# Patient Record
Sex: Female | Born: 1965 | Race: White | Hispanic: No | Marital: Married | Smoking: Former smoker
Health system: Southern US, Community
[De-identification: ages and names within clinical notes are randomized; demographics above are authoritative.]

## PROBLEM LIST (undated history)

## (undated) DIAGNOSIS — E039 Hypothyroidism, unspecified: Secondary | ICD-10-CM

## (undated) DIAGNOSIS — M8580 Other specified disorders of bone density and structure, unspecified site: Secondary | ICD-10-CM

## (undated) HISTORY — PX: GYNECOLOGIC CRYOSURGERY: SHX857

## (undated) HISTORY — PX: NOSE SURGERY: SHX723

## (undated) HISTORY — DX: Hypothyroidism, unspecified: E03.9

## (undated) HISTORY — PX: MANDIBLE SURGERY: SHX707

## (undated) HISTORY — PX: COLPOSCOPY: SHX161

## (undated) HISTORY — DX: Other specified disorders of bone density and structure, unspecified site: M85.80

---

## 1998-02-13 ENCOUNTER — Ambulatory Visit (HOSPITAL_COMMUNITY): Admission: RE | Admit: 1998-02-13 | Discharge: 1998-02-13 | Payer: Self-pay | Admitting: *Deleted

## 1999-02-24 ENCOUNTER — Ambulatory Visit (HOSPITAL_COMMUNITY): Admission: RE | Admit: 1999-02-24 | Discharge: 1999-02-24 | Payer: Self-pay | Admitting: *Deleted

## 1999-02-24 ENCOUNTER — Encounter: Payer: Self-pay | Admitting: *Deleted

## 1999-03-01 ENCOUNTER — Ambulatory Visit (HOSPITAL_COMMUNITY): Admission: RE | Admit: 1999-03-01 | Discharge: 1999-03-01 | Payer: Self-pay | Admitting: *Deleted

## 1999-04-12 ENCOUNTER — Other Ambulatory Visit: Admission: RE | Admit: 1999-04-12 | Discharge: 1999-04-12 | Payer: Self-pay | Admitting: Obstetrics and Gynecology

## 1999-08-18 ENCOUNTER — Inpatient Hospital Stay (HOSPITAL_COMMUNITY): Admission: AD | Admit: 1999-08-18 | Discharge: 1999-08-18 | Payer: Self-pay | Admitting: Obstetrics and Gynecology

## 1999-08-26 ENCOUNTER — Encounter (INDEPENDENT_AMBULATORY_CARE_PROVIDER_SITE_OTHER): Payer: Self-pay | Admitting: Specialist

## 1999-08-26 ENCOUNTER — Ambulatory Visit (HOSPITAL_COMMUNITY): Admission: RE | Admit: 1999-08-26 | Discharge: 1999-08-26 | Payer: Self-pay | Admitting: *Deleted

## 1999-10-26 ENCOUNTER — Ambulatory Visit (HOSPITAL_COMMUNITY): Admission: RE | Admit: 1999-10-26 | Discharge: 1999-10-26 | Payer: Self-pay | Admitting: *Deleted

## 1999-10-26 ENCOUNTER — Encounter: Payer: Self-pay | Admitting: *Deleted

## 2000-03-03 ENCOUNTER — Inpatient Hospital Stay (HOSPITAL_COMMUNITY): Admission: EM | Admit: 2000-03-03 | Discharge: 2000-03-03 | Payer: Self-pay | Admitting: *Deleted

## 2000-06-01 ENCOUNTER — Other Ambulatory Visit: Admission: RE | Admit: 2000-06-01 | Discharge: 2000-06-01 | Payer: Self-pay | Admitting: *Deleted

## 2000-06-22 ENCOUNTER — Encounter: Payer: Self-pay | Admitting: *Deleted

## 2000-06-22 ENCOUNTER — Ambulatory Visit (HOSPITAL_COMMUNITY): Admission: RE | Admit: 2000-06-22 | Discharge: 2000-06-22 | Payer: Self-pay | Admitting: *Deleted

## 2000-08-04 ENCOUNTER — Encounter: Payer: Self-pay | Admitting: *Deleted

## 2000-08-04 ENCOUNTER — Ambulatory Visit (HOSPITAL_COMMUNITY): Admission: RE | Admit: 2000-08-04 | Discharge: 2000-08-04 | Payer: Self-pay | Admitting: *Deleted

## 2000-12-15 ENCOUNTER — Inpatient Hospital Stay (HOSPITAL_COMMUNITY): Admission: AD | Admit: 2000-12-15 | Discharge: 2000-12-18 | Payer: Self-pay | Admitting: *Deleted

## 2000-12-19 ENCOUNTER — Encounter: Admission: RE | Admit: 2000-12-19 | Discharge: 2001-02-22 | Payer: Self-pay | Admitting: *Deleted

## 2000-12-20 ENCOUNTER — Inpatient Hospital Stay (HOSPITAL_COMMUNITY): Admission: AD | Admit: 2000-12-20 | Discharge: 2000-12-20 | Payer: Self-pay | Admitting: *Deleted

## 2001-01-11 ENCOUNTER — Encounter: Payer: Self-pay | Admitting: *Deleted

## 2001-01-11 ENCOUNTER — Observation Stay (HOSPITAL_COMMUNITY): Admission: AD | Admit: 2001-01-11 | Discharge: 2001-01-12 | Payer: Self-pay | Admitting: *Deleted

## 2001-08-13 ENCOUNTER — Other Ambulatory Visit: Admission: RE | Admit: 2001-08-13 | Discharge: 2001-08-13 | Payer: Self-pay | Admitting: *Deleted

## 2002-07-03 ENCOUNTER — Other Ambulatory Visit: Admission: RE | Admit: 2002-07-03 | Discharge: 2002-07-03 | Payer: Self-pay | Admitting: *Deleted

## 2002-11-05 ENCOUNTER — Ambulatory Visit (HOSPITAL_COMMUNITY): Admission: RE | Admit: 2002-11-05 | Discharge: 2002-11-05 | Payer: Self-pay | Admitting: Cardiology

## 2002-11-05 ENCOUNTER — Encounter (INDEPENDENT_AMBULATORY_CARE_PROVIDER_SITE_OTHER): Payer: Self-pay | Admitting: Cardiology

## 2003-07-24 ENCOUNTER — Other Ambulatory Visit: Admission: RE | Admit: 2003-07-24 | Discharge: 2003-07-24 | Payer: Self-pay | Admitting: *Deleted

## 2004-01-06 ENCOUNTER — Other Ambulatory Visit: Admission: RE | Admit: 2004-01-06 | Discharge: 2004-01-06 | Payer: Self-pay | Admitting: Obstetrics and Gynecology

## 2004-07-27 ENCOUNTER — Other Ambulatory Visit: Admission: RE | Admit: 2004-07-27 | Discharge: 2004-07-27 | Payer: Self-pay | Admitting: Obstetrics and Gynecology

## 2004-12-29 ENCOUNTER — Other Ambulatory Visit: Admission: RE | Admit: 2004-12-29 | Discharge: 2004-12-29 | Payer: Self-pay | Admitting: Obstetrics and Gynecology

## 2005-07-20 ENCOUNTER — Other Ambulatory Visit: Admission: RE | Admit: 2005-07-20 | Discharge: 2005-07-20 | Payer: Self-pay | Admitting: Obstetrics and Gynecology

## 2006-03-09 ENCOUNTER — Other Ambulatory Visit: Admission: RE | Admit: 2006-03-09 | Discharge: 2006-03-09 | Payer: Self-pay | Admitting: Obstetrics and Gynecology

## 2006-10-16 ENCOUNTER — Other Ambulatory Visit: Admission: RE | Admit: 2006-10-16 | Discharge: 2006-10-16 | Payer: Self-pay | Admitting: Obstetrics and Gynecology

## 2007-03-22 ENCOUNTER — Other Ambulatory Visit: Admission: RE | Admit: 2007-03-22 | Discharge: 2007-03-22 | Payer: Self-pay | Admitting: Obstetrics and Gynecology

## 2008-04-02 ENCOUNTER — Other Ambulatory Visit: Admission: RE | Admit: 2008-04-02 | Discharge: 2008-04-02 | Payer: Self-pay | Admitting: Obstetrics and Gynecology

## 2009-04-10 ENCOUNTER — Ambulatory Visit: Payer: Self-pay | Admitting: Obstetrics and Gynecology

## 2009-04-10 ENCOUNTER — Encounter: Payer: Self-pay | Admitting: Obstetrics and Gynecology

## 2009-04-10 ENCOUNTER — Other Ambulatory Visit: Admission: RE | Admit: 2009-04-10 | Discharge: 2009-04-10 | Payer: Self-pay | Admitting: Obstetrics and Gynecology

## 2009-05-01 ENCOUNTER — Encounter: Admission: RE | Admit: 2009-05-01 | Discharge: 2009-05-01 | Payer: Self-pay | Admitting: Radiology

## 2009-11-30 ENCOUNTER — Ambulatory Visit: Payer: Self-pay | Admitting: Obstetrics and Gynecology

## 2010-04-20 ENCOUNTER — Other Ambulatory Visit: Admission: RE | Admit: 2010-04-20 | Discharge: 2010-04-20 | Payer: Self-pay | Admitting: Obstetrics and Gynecology

## 2010-04-20 ENCOUNTER — Ambulatory Visit: Payer: Self-pay | Admitting: Obstetrics and Gynecology

## 2010-11-05 ENCOUNTER — Ambulatory Visit
Admission: RE | Admit: 2010-11-05 | Discharge: 2010-11-05 | Payer: Self-pay | Source: Home / Self Care | Attending: Obstetrics and Gynecology | Admitting: Obstetrics and Gynecology

## 2010-11-08 ENCOUNTER — Ambulatory Visit
Admission: RE | Admit: 2010-11-08 | Discharge: 2010-11-08 | Payer: Self-pay | Source: Home / Self Care | Attending: Obstetrics and Gynecology | Admitting: Obstetrics and Gynecology

## 2010-11-21 ENCOUNTER — Encounter: Payer: Self-pay | Admitting: Obstetrics and Gynecology

## 2010-12-21 ENCOUNTER — Ambulatory Visit: Payer: Self-pay | Admitting: Obstetrics and Gynecology

## 2010-12-21 ENCOUNTER — Other Ambulatory Visit: Payer: Self-pay

## 2010-12-22 ENCOUNTER — Other Ambulatory Visit: Payer: Self-pay

## 2010-12-22 ENCOUNTER — Ambulatory Visit: Payer: Self-pay | Admitting: Obstetrics and Gynecology

## 2010-12-22 ENCOUNTER — Other Ambulatory Visit: Payer: BC Managed Care – PPO

## 2010-12-22 ENCOUNTER — Ambulatory Visit (INDEPENDENT_AMBULATORY_CARE_PROVIDER_SITE_OTHER): Payer: BC Managed Care – PPO | Admitting: Obstetrics and Gynecology

## 2010-12-22 DIAGNOSIS — N949 Unspecified condition associated with female genital organs and menstrual cycle: Secondary | ICD-10-CM

## 2010-12-22 DIAGNOSIS — D3910 Neoplasm of uncertain behavior of unspecified ovary: Secondary | ICD-10-CM

## 2010-12-22 DIAGNOSIS — N83209 Unspecified ovarian cyst, unspecified side: Secondary | ICD-10-CM

## 2010-12-27 ENCOUNTER — Ambulatory Visit (INDEPENDENT_AMBULATORY_CARE_PROVIDER_SITE_OTHER): Payer: BC Managed Care – PPO | Admitting: Women's Health

## 2010-12-27 DIAGNOSIS — R82998 Other abnormal findings in urine: Secondary | ICD-10-CM

## 2010-12-27 DIAGNOSIS — N390 Urinary tract infection, site not specified: Secondary | ICD-10-CM

## 2011-01-12 ENCOUNTER — Other Ambulatory Visit: Payer: BC Managed Care – PPO

## 2011-02-14 ENCOUNTER — Other Ambulatory Visit: Payer: BC Managed Care – PPO

## 2011-02-14 ENCOUNTER — Ambulatory Visit: Payer: BC Managed Care – PPO | Admitting: Obstetrics and Gynecology

## 2011-02-23 ENCOUNTER — Other Ambulatory Visit: Payer: BC Managed Care – PPO

## 2011-02-23 ENCOUNTER — Ambulatory Visit (INDEPENDENT_AMBULATORY_CARE_PROVIDER_SITE_OTHER): Payer: BC Managed Care – PPO | Admitting: Obstetrics and Gynecology

## 2011-02-23 DIAGNOSIS — D3910 Neoplasm of uncertain behavior of unspecified ovary: Secondary | ICD-10-CM

## 2011-02-23 DIAGNOSIS — R5383 Other fatigue: Secondary | ICD-10-CM

## 2011-02-23 DIAGNOSIS — R5381 Other malaise: Secondary | ICD-10-CM

## 2011-02-23 DIAGNOSIS — N831 Corpus luteum cyst of ovary, unspecified side: Secondary | ICD-10-CM

## 2011-02-23 DIAGNOSIS — E039 Hypothyroidism, unspecified: Secondary | ICD-10-CM

## 2011-02-23 DIAGNOSIS — N912 Amenorrhea, unspecified: Secondary | ICD-10-CM

## 2011-03-18 NOTE — Discharge Summary (Signed)
[facility]  Patient:    Kylie Kelley, Kylie Kelley                   MRN: [id number] Adm. Date:  [id number] Disc. Date: 12/18/00 Attending:  Pleas Koch                           Discharge Summary  ADMISSION DIAGNOSES:          1. Pregnancy.                               2. Previous cesarean section.  DISCHARGE DIAGNOSES:          1. Pregnancy.                               2. Previous cesarean section.                               3. Compensated anemia.                               4. Mild wound disruption.  HISTORY OF PRESENT ILLNESS AND HOSPITAL COURSE:          This is a 45 year old white female admitted at term for repeat cesarean section.  Underwent a repeat cesarean section of a viable female infant on December 15, 2000.  The operation was uneventful, with estimated blood loss of 500 cc.  The patient postoperatively did well.  Had one episode of severe nausea and vomiting on the day of surgery.  The patient had no lightheadedness or dizziness.  She had good urinary output.  Hemoglobin on postoperative day #1 was 6.7 from a preoperative of 12.8; hematocrit was 18.7, WBC 12.1, platelets adequate.  Urine output was clear.  There was some misalignment of the incision, and approximately one-half of the staples had come free from the incision.  Benzoin and Steri-Strips realigned this.  There was a small amount of serosanguineous drainage.  The patient was watched carefully with hemoglobins being checked every 12 hours for the next 36 hours. Hemoglobin was 6.1 and then 5.9 and then 6.0 and then 5.8 consecutively every 12 hours.  The patient remained afebrile.  Her pulse remained 80-100 without dizziness, lightheadedness, nausea when walking.  She ambulated without difficulty.  She voided without difficulty.  She was advanced to regular food and passed gas and had a normal bowel movement.  She was started on iron 325 t.i.d.  Her wound continued to  be approximated relatively well with Steri-Strips.  A small amount of serosanguineous drainage was noted without evidence of redness, induration, or infection.  The patient remained afebrile. Long discussion with the patient regarding transfusion ensued.  As the patient was stable, was minimally symptomatic other than fatigue, her vital signs were stable, and she was able to ambulate and do what she would ordinarily do at home during the first week, it was thought that transfusion would not be necessary at this time.  She will be discharged home on December 18, 2000, to follow up in the office on December 22, 2000, for hemoglobin recheck.  She was given instructions to watch for wound infection, watch for increased dizziness, lightheadedness, chest pain, shortness  of breath which may indicate decrease in hemoglobin.  She will continue iron and prenatal vitamins. Continue nursing.  Call for any problems.  She was given a discharge instruction booklet from the practice.  Questions were answered.DD:  12/18/00 TD:  12/18/00 Job: [id number]

## 2011-03-18 NOTE — Discharge Summary (Signed)
[facility]  Patient:    Kylie Kelley, Kylie Kelley                   MRN: [id number] Adm. Date:  [id number] Disc. Date: 01/12/01 Attending:  Pleas Koch                           Discharge Summary  ADMISSION DIAGNOSES:          1. Postpartum endomyometritis.                               2. Leukopenia.  DISCHARGE DIAGNOSES:          1. Postpartum endomyometritis.                               2. Leukopenia.                               3. Clinically improved.  BRIEF HISTORY:                This is a 45 year old white female three weeks post cesarean section who presented with a fever and lower abdominal pain. She had a significantly tender uterus which was 14-week size. Cervix was closed. An ultrasound failed to reveal any collections or masses of significance. Her WBC was 1.9, repeated at 1.8. Hemoglobin is 11.8. Urinalysis was negative and culture is pending.  HOSPITAL COURSE:              She was admitted and placed on clindamycin and gentamicin dose per pharmacy. Initial fever which had been 102 defervesced. Her Tmax was 100.7. Her abdomen was significantly less tender. She was urinating. She complained of slight dizziness. She was given Dramamine for this. She was tolerating a normal diet and ambulating. She had minimal vaginal bleeding and she was discharged home to continue IV antibiotics through a home nursing service, gentamicin 130 mg q.8h., clindamycin 900 mg q8h. for 48 hours. The patient will take Dramamine p.r.n. The patient is to call for fever over 100.9 degrees, increasing abdominal pain, severe nausea or vomiting, heavy vaginal bleeding. She will follow up in the office in two to three days. D:  01/12/01 TD:  01/12/01 Job: [id number]

## 2011-03-18 NOTE — H&P (Signed)
[facility]  Patient:    Kylie Kelley, Kylie Kelley                   MRN: [id number] Adm. Date:  [id number] Attending:  Pleas Koch                         History and Physical  OBSERVATION NOTE  DIAGNOSES:                    Three weeks post cesarean section, suspect endomyometritis and fever.  HISTORY OF PRESENT ILLNESS:   This is a 45 year old, gravida 8, para 3-0-5-4, who is three weeks post elective cesarean section after declining VBAC. Immediately postoperative she had a significant drop in hemoglobin which stabilized and the patients hemoglobin rose from 5.8 to 13 in a period of two weeks on aggressive iron therapy.  She began having fevers on the morning of evaluation, some lower back pain, had had a little bit of increased vaginal bleeding and vaginal discharge over the last couple of days.  She had some nausea but no vomiting and was having normal bowel movements and no dysuria. The patient was evaluated in maternity admissions because of uterine tenderness and a negative urinalysis.  I suspected that she had a adenomyometritis.  PAST HISTORY:                 She has had a vaginal delivery, two cesarean section, four miscarriages.  MEDICATIONS:                  She is on prenatal vitamins, iron and Synthroid.  ALLERGIES:                    She has no known drug allergies.  PAST GYNECOLOGIC HISTORY:     Negative.  PAST SURGICAL HISTORY:        As above.  PAST MEDICAL HISTORY:         Only hypothyroidism.  SOCIAL HISTORY:               No smoking, alcohol or recreational drugs.  PHYSICAL EXAMINATION:  VITAL SIGNS:                  Physical examination reveals a blood pressure of 103/56, respiratory rate 20, pulse 95 and temperature of 97.0.  GENERAL APPEARANCE:           She is a mildly, ill-appearing, white female in no acute distress.  Normal mood and affect, well-developed and well-nourished.  HEENT:                         Normal.  NECK:                         Without thyromegaly.  LUNGS:                        Clear.  HEART:                        Regular rate and rhythm.  BREASTS:                      Lactating but no masses or redness.  BACK:  No CVA tenderness.  ABDOMEN:                      Tender suprapubically and over the uterus as it was 14-week size.  Her C-section incision showed no evidence of induration, collection, or separation.  PELVIC:                       The external genitals were normal.  The vagina had a small amount of discharge.  Wet prep was negative.  There was some cervical motion tenderness but the cervix was closed.  Uterus is 14 weeks size and generally tender which reproduced the patients pain.  Adnexa nonpalpable.  RECTAL:                       Not examined.  SKIN:                         Warm and dry.  LABORATORY DATA:              Ultrasound of the pelvis revealed a small amount of fluid in the endometrial canal, a small amount of fluid anterior to the uterus, a small 5 mm dilated tubular structure adjacent to the left ovary, possibly representing a dilated tube and a small focus in the right ovary, possibly a hemorrhagic cyst, but no large collections or free peritoneal fluid.  Her CBC showed a white count of 1.9, hemoglobin 11.6, platelet count 160,000. UA was negative.  IMPRESSION/PLAN:              The patient to be brought in for 23-hour observation.  She will receive gentamicin and clindamycin per pharmacy protocol.  Repeat WBC in 12 hours and in the morning.  Follow the patient clinically for improvement. DD:  01/11/01 TD:  01/11/01 Job: [id number]

## 2011-03-18 NOTE — Op Note (Signed)
[facility]  Patient:    Kylie Kelley, Kylie Kelley                     MRN: [id number] Proc. Date: 12/15/00 Attending:  Georgina Peer, M.D.                           Operative Report  PREOPERATIVE DIAGNOSES:       1. Intrauterine pregnancy at term.                               2. Previous cesarean section, declines vaginal                                  birth after cesarean section.  POSTOPERATIVE DIAGNOSES:      1. Intrauterine pregnancy at term.                               2. Previous cesarean section, declines vaginal                                  birth after cesarean section.  OPERATION PERFORMED:          Repeat low transverse cesarean section.  SURGEON:                      Georgina Peer, M.D.  ASSISTANT:                    Rudy Jew. Ashley Royalty, M.D.  ANESTHESIA:                   Spinal.  ANESTHESIOLOGIST:             Ellison Hughs., M.D.  ESTIMATED BLOOD LOSS:         500 cc.  COMPLICATIONS:                None.  FINDINGS:                     A 6 lb 8 oz female at 54 with Apgars of 9 and 9.  Normal tubes and ovaries.  INDICATIONS FOR PROCEDURE:    This is a 45 year old gravida 7, para 2-0-4-3 with an EDC of December 21, 2000 with previous cesarean section for twins. She declines VBAC and presents for schedule repeat cesarean section.  She is aware of the risks and complications of cesarean including bowel, bladder or vascular injury; increased risk of bleeding and thrombotic events and accepts these and is will to proceed.  The fetus had been active.  She had recently been exposed to Parvo virus through her children and had a Parvo virus titer on February 12, showing IgG of 0.2 and IgM positive at 2.8.  She had no signs of fever, rash or other physical symptoms.  DESCRIPTION OF PROCEDURE:     The patient was taken to the operating room and given a spinal anesthetic.  She was placed in the dorsal supine left  lateral uterine displacement position.  The abdomen and perineum were prepped and draped sterilely.  A catheter was placed in the bladder.  Fetal heart tones  were normal.  After adequate anesthesia was documented with an ______ test, a transverse incision was made through the previous skin incision, taken through the fascia, entering the peritoneal cavity in the normal Pfannenstiel manner with bleeders cauterized.  There were no adhesions noted.  A bladder flap transversely was created and a bladder blade placed over the bladder.  A transverse uterine incision was made and, at 1426, membranes were rupture, yielding clear fluid.  The incision was widened with bandage scissors and an infant female was delivered with the aid of fundal pressure.  The mouth and nose were suctioned.  The infant cried spontaneously.  The cord was clamped and cut and the infant was handed to the pediatric team in attendance.  Cord blood was taken.  The placental membranes were expressed and removed.  The incision was without extension and closed in one layer of running locked Vicryl with interrupted sutures of Vicryl used to control bleeding along the right edge of the incision.  Irrigation removed any blood and debris.  Small bleeders along the peritoneal edge of the bladder were cauterized superficially.  The incision was hemostatic and all debris was removed. Inspection of the underside of the muscles, fascia and peritoneum revealed no excess bleeding.  The fascia was closed with Vicryl suture.  The underside of the subcu was cauterized for any bleeding.  The skin was closed with skin staples.  Sponge, needle and instrument counts were correct.  The infant went to the regular nursery.  The patient went to the recovery area in stable condition. DD:  12/15/00 TD:  12/15/00 Job: [id number]

## 2011-05-13 ENCOUNTER — Other Ambulatory Visit: Payer: BC Managed Care – PPO

## 2011-05-13 ENCOUNTER — Ambulatory Visit (INDEPENDENT_AMBULATORY_CARE_PROVIDER_SITE_OTHER): Payer: BC Managed Care – PPO | Admitting: Obstetrics and Gynecology

## 2011-05-13 DIAGNOSIS — D3910 Neoplasm of uncertain behavior of unspecified ovary: Secondary | ICD-10-CM

## 2011-05-13 DIAGNOSIS — R1031 Right lower quadrant pain: Secondary | ICD-10-CM

## 2011-07-06 DIAGNOSIS — E039 Hypothyroidism, unspecified: Secondary | ICD-10-CM | POA: Insufficient documentation

## 2011-07-06 DIAGNOSIS — N870 Mild cervical dysplasia: Secondary | ICD-10-CM | POA: Insufficient documentation

## 2011-07-14 ENCOUNTER — Encounter: Payer: BC Managed Care – PPO | Admitting: Obstetrics and Gynecology

## 2011-07-14 ENCOUNTER — Other Ambulatory Visit: Payer: BC Managed Care – PPO

## 2011-08-02 ENCOUNTER — Other Ambulatory Visit: Payer: Self-pay | Admitting: Obstetrics and Gynecology

## 2011-08-02 DIAGNOSIS — D3910 Neoplasm of uncertain behavior of unspecified ovary: Secondary | ICD-10-CM

## 2011-08-02 DIAGNOSIS — N83209 Unspecified ovarian cyst, unspecified side: Secondary | ICD-10-CM

## 2011-08-03 ENCOUNTER — Ambulatory Visit (INDEPENDENT_AMBULATORY_CARE_PROVIDER_SITE_OTHER): Payer: BC Managed Care – PPO

## 2011-08-03 ENCOUNTER — Other Ambulatory Visit (HOSPITAL_COMMUNITY)
Admission: RE | Admit: 2011-08-03 | Discharge: 2011-08-03 | Disposition: A | Discharge status: Home / Self Care | Payer: BC Managed Care – PPO | Source: Ambulatory Visit | Attending: Obstetrics and Gynecology | Admitting: Obstetrics and Gynecology

## 2011-08-03 ENCOUNTER — Encounter: Payer: Self-pay | Admitting: Obstetrics and Gynecology

## 2011-08-03 ENCOUNTER — Ambulatory Visit (INDEPENDENT_AMBULATORY_CARE_PROVIDER_SITE_OTHER): Payer: BC Managed Care – PPO | Admitting: Obstetrics and Gynecology

## 2011-08-03 VITALS — BP 110/70 | Ht 64.5 in | Wt 110.0 lb

## 2011-08-03 DIAGNOSIS — E039 Hypothyroidism, unspecified: Secondary | ICD-10-CM

## 2011-08-03 DIAGNOSIS — N83209 Unspecified ovarian cyst, unspecified side: Secondary | ICD-10-CM

## 2011-08-03 DIAGNOSIS — Z01419 Encounter for gynecological examination (general) (routine) without abnormal findings: Secondary | ICD-10-CM

## 2011-08-03 DIAGNOSIS — D3910 Neoplasm of uncertain behavior of unspecified ovary: Secondary | ICD-10-CM

## 2011-08-03 NOTE — Progress Notes (Signed)
The patient came back to see me today for her annual GYN exam. She contraceptives with condoms. She is 4 to five-day periods are extremely light. We are treating her for hypothyroidism with excellent results. She is up-to-date on her mammograms. She was slightly anemic when I last saw her. Her PCP is now working her up for this and will let me know. I don't believe it's related to heavy periods. We have been watching her with bilateral ovarian cysts. She had an ultrasound today as well. The results were as follows. Her uterus is normal with a homogeneous echo pattern. Her endometrial echo is thin as 3.3 mm. Her right ovary is now normal with the previous solid mass gone. Her left ovary still shows an ovarian cyst but is significantly decreased in size. It is now 9 x 8 mm. It is negative for PFD. There is 2 mm calcification in the wall the ovary. Her cul-de-sac is free of fluid.  Physical examination: HEENT within normal limits. Neck: Thyroid not large. No masses. Supraclavicular nodes: not enlarged. Breasts: Examined in both sitting midline position. No skin changes and no masses. Abdomen: Soft no guarding rebound or masses or hernia. Pelvic: External: Within normal limits. BUS: Within normal limits. Vaginal:within normal limits. Good estrogen effect. No evidence of cystocele rectocele or enterocele. Cervix: clean. Uterus: Normal size and shape. Adnexa: No masses. Rectovaginal exam: Confirmatory and negative. Extremities: Within normal limits.  Assessment: #1. Small ovarian cyst #2. Slight anemia with normal periods #3. Hypothyroidism  Plan: She will keep you updated on anemia workup. She will continue her Synthroid. She is up-to-date on TSH. She will have a followup ultrasound in 6 months. She will continue yearly mammograms.

## 2011-08-29 ENCOUNTER — Telehealth: Payer: Self-pay | Admitting: *Deleted

## 2011-08-29 ENCOUNTER — Ambulatory Visit: Payer: BC Managed Care – PPO | Admitting: Obstetrics and Gynecology

## 2011-08-29 ENCOUNTER — Other Ambulatory Visit: Payer: Self-pay | Admitting: *Deleted

## 2011-08-29 DIAGNOSIS — N390 Urinary tract infection, site not specified: Secondary | ICD-10-CM

## 2011-08-29 MED ORDER — NITROFURANTOIN MONOHYD MACRO 100 MG PO CAPS: 100.0000 mg | ORAL_CAPSULE | Freq: Two times a day (BID) | ORAL | Status: AC

## 2011-08-29 NOTE — Telephone Encounter (Signed)
Tell patient urine definitely showed an infection. Treat with Macrobid twice a day with food for 7 days. Followup urinalysis next week.

## 2011-08-29 NOTE — Telephone Encounter (Signed)
Patient informed below.  Med sent in and order in pc for f/u u/a.

## 2011-08-29 NOTE — Telephone Encounter (Signed)
Patient came by this morning and brought in a urine.  C/o painful urination.  Appointments made her an appointment to come back at 12:30 and she said she can not return today and wants to know if you can treat? (U/A should be in your box)

## 2011-08-30 ENCOUNTER — Telehealth: Payer: Self-pay | Admitting: *Deleted

## 2011-08-30 MED ORDER — PHENAZOPYRIDINE HCL 200 MG PO TABS: 200.0000 mg | ORAL_TABLET | Freq: Three times a day (TID) | ORAL | Status: DC | PRN

## 2011-08-30 NOTE — Telephone Encounter (Signed)
Pt informed rx sent to phar

## 2011-08-30 NOTE — Telephone Encounter (Signed)
Pt was given Medication yesterday for UTI and is now asking for an antispasm medication. Pls advise

## 2011-08-30 NOTE — Telephone Encounter (Signed)
Either give her Pyridium 200 mg 3 times a day or Urispas( generic) 3 times a day whichever is cheaper.

## 2011-09-20 ENCOUNTER — Other Ambulatory Visit: Payer: Self-pay

## 2011-09-20 DIAGNOSIS — E079 Disorder of thyroid, unspecified: Secondary | ICD-10-CM

## 2011-09-20 MED ORDER — LEVOTHYROXINE SODIUM 112 MCG PO TABS: 112.0000 ug | ORAL_TABLET | Freq: Every day | ORAL | Status: DC

## 2011-09-20 NOTE — Telephone Encounter (Signed)
PAP 08-2011. OK TO REFILL X 1 YEAR?

## 2011-09-20 NOTE — Telephone Encounter (Signed)
SENT RX TO PHARMACY & PUT RECALL IN FOR TSH.

## 2011-09-20 NOTE — Telephone Encounter (Signed)
Okay to call in with refills for one year. However she should stop by for her TSH in April of 2013.

## 2012-02-07 ENCOUNTER — Other Ambulatory Visit: Payer: Self-pay | Admitting: *Deleted

## 2012-02-07 DIAGNOSIS — E039 Hypothyroidism, unspecified: Secondary | ICD-10-CM

## 2012-02-29 DIAGNOSIS — M8580 Other specified disorders of bone density and structure, unspecified site: Secondary | ICD-10-CM

## 2012-02-29 HISTORY — DX: Other specified disorders of bone density and structure, unspecified site: M85.80

## 2012-03-07 ENCOUNTER — Ambulatory Visit (INDEPENDENT_AMBULATORY_CARE_PROVIDER_SITE_OTHER): Payer: BC Managed Care – PPO | Admitting: Obstetrics and Gynecology

## 2012-03-07 ENCOUNTER — Ambulatory Visit: Payer: BC Managed Care – PPO

## 2012-03-07 ENCOUNTER — Other Ambulatory Visit: Payer: BC Managed Care – PPO

## 2012-03-07 ENCOUNTER — Ambulatory Visit: Payer: BC Managed Care – PPO | Admitting: Obstetrics and Gynecology

## 2012-03-07 ENCOUNTER — Ambulatory Visit (INDEPENDENT_AMBULATORY_CARE_PROVIDER_SITE_OTHER): Payer: BC Managed Care – PPO

## 2012-03-07 DIAGNOSIS — N83209 Unspecified ovarian cyst, unspecified side: Secondary | ICD-10-CM

## 2012-03-07 DIAGNOSIS — N920 Excessive and frequent menstruation with regular cycle: Secondary | ICD-10-CM

## 2012-03-07 DIAGNOSIS — N912 Amenorrhea, unspecified: Secondary | ICD-10-CM

## 2012-03-07 LAB — FOLLICLE STIMULATING HORMONE: FSH: 132.9 m[IU]/mL — ABNORMAL HIGH

## 2012-03-07 LAB — HCG, SERUM, QUALITATIVE: Preg, Serum: NEGATIVE

## 2012-03-07 NOTE — Progress Notes (Signed)
Patient came back today for further monitoring of a cyst on her left ovary which has continued to get smaller. Patient is no longer having any pain. Her menstrual cycles have been irregular with her last period on 01/11/2012. She is having some night sweats. She contraception with condoms. 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  On ultrasound today she has an anteverted uterus with cortical cystic areas at the wall the endometrial cavity consistent with adenomyosis. Her endometrial echo is 6.9 mm. Her right ovary is normal. Her left ovary is normal except for a microcalcification of 1.7 mm. There is no evidence of the previous ovarian cyst. Her cul-de-sac is free of fluid.  Assessment: #1. Resolution of left ovarian cyst. #2. Oligo amenorrhea with transitional symptoms.  Plan: FSH and qualitative hCG drawn. Patient is not symptomatic enough for treatment. If her FSH is normal she knows we will withdrawal her with Provera. If her FSH is elevated we will do a bone density here.

## 2012-03-08 ENCOUNTER — Other Ambulatory Visit: Payer: Self-pay | Admitting: *Deleted

## 2012-03-08 DIAGNOSIS — Z780 Asymptomatic menopausal state: Secondary | ICD-10-CM

## 2012-03-15 ENCOUNTER — Ambulatory Visit (INDEPENDENT_AMBULATORY_CARE_PROVIDER_SITE_OTHER): Payer: BC Managed Care – PPO

## 2012-03-15 DIAGNOSIS — M899 Disorder of bone, unspecified: Secondary | ICD-10-CM

## 2012-03-15 DIAGNOSIS — Z780 Asymptomatic menopausal state: Secondary | ICD-10-CM

## 2012-03-15 DIAGNOSIS — M8580 Other specified disorders of bone density and structure, unspecified site: Secondary | ICD-10-CM

## 2012-04-26 ENCOUNTER — Encounter: Payer: Self-pay | Admitting: Obstetrics and Gynecology

## 2012-08-03 ENCOUNTER — Encounter: Payer: BC Managed Care – PPO | Admitting: Obstetrics and Gynecology

## 2012-08-07 ENCOUNTER — Ambulatory Visit (INDEPENDENT_AMBULATORY_CARE_PROVIDER_SITE_OTHER): Payer: BC Managed Care – PPO | Admitting: Obstetrics and Gynecology

## 2012-08-07 DIAGNOSIS — Z01419 Encounter for gynecological examination (general) (routine) without abnormal findings: Secondary | ICD-10-CM

## 2012-08-07 NOTE — Progress Notes (Signed)
Chart open by mistake. Patient did not keep appointment. She will be called.

## 2012-08-21 ENCOUNTER — Encounter: Payer: BC Managed Care – PPO | Admitting: Obstetrics and Gynecology

## 2012-08-21 ENCOUNTER — Other Ambulatory Visit (HOSPITAL_COMMUNITY)
Admission: RE | Admit: 2012-08-21 | Discharge: 2012-08-21 | Disposition: A | Discharge status: Home / Self Care | Payer: BC Managed Care – PPO | Source: Ambulatory Visit | Attending: Obstetrics and Gynecology | Admitting: Obstetrics and Gynecology

## 2012-08-21 ENCOUNTER — Encounter: Payer: Self-pay | Admitting: Obstetrics and Gynecology

## 2012-08-21 ENCOUNTER — Ambulatory Visit (INDEPENDENT_AMBULATORY_CARE_PROVIDER_SITE_OTHER): Payer: BC Managed Care – PPO | Admitting: Obstetrics and Gynecology

## 2012-08-21 VITALS — BP 120/74 | Ht 64.5 in | Wt 114.0 lb

## 2012-08-21 DIAGNOSIS — Z01419 Encounter for gynecological examination (general) (routine) without abnormal findings: Secondary | ICD-10-CM | POA: Insufficient documentation

## 2012-08-21 DIAGNOSIS — E039 Hypothyroidism, unspecified: Secondary | ICD-10-CM

## 2012-08-21 LAB — URINALYSIS W MICROSCOPIC + REFLEX CULTURE
Leukocytes, UA: NEGATIVE
Nitrite: NEGATIVE
Specific Gravity, Urine: 1.01 (ref 1.005–1.030)
pH: 6 (ref 5.0–8.0)

## 2012-08-21 LAB — CBC WITH DIFFERENTIAL/PLATELET
Basophils Absolute: 0 10*3/uL (ref 0.0–0.1)
Basophils Relative: 0 % (ref 0–1)
Eosinophils Relative: 3 % (ref 0–5)
HCT: 33.4 % — ABNORMAL LOW (ref 36.0–46.0)
Lymphocytes Relative: 29 % (ref 12–46)
MCHC: 34.1 g/dL (ref 30.0–36.0)
MCV: 91.5 fL (ref 78.0–100.0)
Monocytes Absolute: 0.4 10*3/uL (ref 0.1–1.0)
Monocytes Relative: 9 % (ref 3–12)
RDW: 14.3 % (ref 11.5–15.5)

## 2012-08-21 LAB — LIPID PANEL
HDL: 73 mg/dL (ref >39)
Triglycerides: 36 mg/dL (ref <150)

## 2012-08-21 MED ORDER — LEVOTHYROXINE SODIUM 112 MCG PO TABS: 112.0000 ug | ORAL_TABLET | Freq: Every day | ORAL | Status: DC

## 2012-08-21 NOTE — Progress Notes (Addendum)
Patient came to see me today for her annual GYN exam. Earlier this year she had an elevated FSH consistent with menopause. She has continued with amenorrhea. For a while she had severe hot flashes but they have tapered off. She is having mild vaginal dryness but has not required treatment. This weekend she had a urinary tract infection and was treated by Dr. Hyacinth Meeker over the phone. She is asymptomatic we rechecked her urine today. Although her urinary symptoms are gone she is having some lower abdominal cramping. I suspect it's the end of the bladder sensitivity. She remains on Synthroid for hypothyroidism and feels fine. She had normal bone density this year. 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 She has had normal Pap smears since then. Her last Pap smear was 2012. She has a paternal grandmother who had breast cancer slightly before the age of 24. Jill Side thinks we checked her for BRCA1 and 2 But  we cannot find it  in the record.   Physical examination:Kim Julian Reil present. HEENT within normal limits. Neck: Thyroid not large. No masses. Supraclavicular nodes: not enlarged. Breasts: Examined in both sitting and lying  position. No skin changes and no masses. Abdomen: Soft no guarding rebound or masses or hernia. Pelvic: External: Within normal limits. BUS: Within normal limits. Vaginal:within normal limits. Good estrogen effect. No evidence of cystocele rectocele or enterocele. Cervix: clean. Uterus: Normal size and shape. Adnexa: No masses. Rectovaginal exam: Confirmatory and negative. Extremities: Within normal limits.  Assessment: #1. Hypothyroidism #2. CIN-1 #3. Resolving UTI #4. Abdominal cramping  Plan: Appropriate lab done. Continue yearly mammograms. We will see if she was checked for BRCA1 and 2. Continue Synthroid. If pelvic cramping persists return for pelvic ultrasound.  After the patient left we called  [facility]. She never had BRCA 1 or BRCA 2 testing. I have left a message  for patient that I think she should get genetic counselling at the cancer center.

## 2012-08-21 NOTE — Patient Instructions (Addendum)
Return in 2 weeks for ultrasound is still cramping.

## 2012-08-23 ENCOUNTER — Telehealth: Payer: Self-pay | Admitting: Obstetrics and Gynecology

## 2012-08-23 ENCOUNTER — Other Ambulatory Visit: Payer: BC Managed Care – PPO

## 2012-08-23 ENCOUNTER — Other Ambulatory Visit: Payer: Self-pay | Admitting: Obstetrics and Gynecology

## 2012-08-23 DIAGNOSIS — D649 Anemia, unspecified: Secondary | ICD-10-CM

## 2012-08-23 LAB — CBC WITH DIFFERENTIAL/PLATELET
HCT: 36.9 % (ref 36.0–46.0)
Hemoglobin: 11.9 g/dL — ABNORMAL LOW (ref 12.0–15.0)
Lymphocytes Relative: 24 % (ref 12–46)
Lymphs Abs: 1.2 10*3/uL (ref 0.7–4.0)
MCHC: 32.2 g/dL (ref 30.0–36.0)
Monocytes Absolute: 0.3 10*3/uL (ref 0.1–1.0)
Monocytes Relative: 6 % (ref 3–12)
Neutro Abs: 3.5 10*3/uL (ref 1.7–7.7)
Neutrophils Relative %: 67 % (ref 43–77)
RBC: 3.89 MIL/uL (ref 3.87–5.11)
WBC: 5.2 10*3/uL (ref 4.0–10.5)

## 2012-08-23 LAB — IRON AND TIBC
%SAT: 34 % (ref 20–55)
Iron: 123 ug/dL (ref 42–145)
TIBC: 363 ug/dL (ref 250–470)
UIBC: 240 ug/dL (ref 125–400)

## 2012-08-23 LAB — FERRITIN: Ferritin: 18 ng/mL (ref 10–291)

## 2012-08-23 NOTE — Telephone Encounter (Signed)
When I spoke with patient yesterday about her anemia and needing additional lab work she had another issue she wanted to discuss.  She was under the impression she once had BRACA testing done but upon discussion (about it being ordered because she had an ovarian cyst) we determined that she was confusing it with the CA-125 she actually had 02/23/11.    She said that you had recommended genetic counseling at [facility] and patient is ready to do whatever she needs to do to proceed with [facility] testing. (PGM with bx of breast cancer.)  Please advise.  Hardcopy chart is on your table in your office.

## 2012-08-23 NOTE — Telephone Encounter (Signed)
She should call the cancer center adjacent to [facility]. She should get an appointment with genetic counseling to discuss BRCA1 and BRCA2 testing. They will take her family history and if everybody agrees she should be tested she will be tested there and we will get the results. She needs to tell them  that we referred her.

## 2012-08-23 NOTE — Telephone Encounter (Signed)
Patient informed and instructed.

## 2012-08-24 LAB — VITAMIN D 25 HYDROXY (VIT D DEFICIENCY, FRACTURES): Vit D, 25-Hydroxy: 43 ng/mL (ref 30–89)

## 2012-08-28 ENCOUNTER — Telehealth: Payer: Self-pay | Admitting: *Deleted

## 2012-08-28 ENCOUNTER — Telehealth: Payer: Self-pay | Admitting: Genetic Counselor

## 2012-08-28 ENCOUNTER — Telehealth: Payer: Self-pay | Admitting: Obstetrics and Gynecology

## 2012-08-28 ENCOUNTER — Ambulatory Visit (INDEPENDENT_AMBULATORY_CARE_PROVIDER_SITE_OTHER): Payer: BC Managed Care – PPO | Admitting: Obstetrics and Gynecology

## 2012-08-28 DIAGNOSIS — N950 Postmenopausal bleeding: Secondary | ICD-10-CM

## 2012-08-28 NOTE — Telephone Encounter (Signed)
Left message on pt voicemail as requested regarding the below note. Pt informed with all normal lab work as well from OV. Appointment time for BRCA 1 & 2 testing on Nov 05 2012 @ 11:00am

## 2012-08-28 NOTE — Telephone Encounter (Signed)
Lab should be done at cancer Center.

## 2012-08-28 NOTE — Telephone Encounter (Signed)
S/W Dr. Eda Paschal office in re to Genetic referral appt scheduled for 1/06 @11 . Referring office will notify pt.  Welcome packet mailed w/calendar

## 2012-08-28 NOTE — Telephone Encounter (Signed)
Pt asked me to relay 2 things to you.   1. Pt said she couldn't get an appointment at cancer center for the brca 1 & 2 testing, so I called and pt has appointment on Jan. 6, 2014 @11 :00am. Pt asked if she could have lab work drawn here in the office? I told pt I wasn't sure if this could be done.  2. Pt said that she is still having cramping, had some light spotting yesterday, no spotting today, c/o nausea a well. Pt asked me to relay this.

## 2012-08-28 NOTE — Addendum Note (Signed)
Addended by: Dayna Barker on: 08/28/2012 04:42 PM   Modules accepted: Orders

## 2012-08-28 NOTE — Progress Notes (Signed)
Patient had her last menstrual period on 01/11/2012. In May of this year her [facility] was 133. Yesterday she started light bleeding and is still bleeding. It is associated with cramping. She did have some premenstrual symptoms.  Exam: Kennon Portela present.Pelvic exam: External within normal limits. BUS within normal limits. Vaginal exam within normal limits. Cervix is clean without lesions. Uterus is normal size and shape. Adnexa failed to reveal masses. Rectovaginal examination is confirmatory and without masses.   Assessment: Postmenopausal bleeding  Plan: Endometrial biopsy done. We will call patient with results. For the moment no imaging study planned. Obviously we will do so if she continues to bleed or has cramping without bleeding.

## 2012-08-28 NOTE — Telephone Encounter (Signed)
Lab should be done at the cancer center. Office visit because of the spotting.

## 2012-08-28 NOTE — Telephone Encounter (Signed)
Kylie Kelley informed patient.

## 2012-08-28 NOTE — Telephone Encounter (Signed)
Tell patient all lab came back normal. Recheck of anemia also showed it was not  present. No further evaluation needed.

## 2012-08-28 NOTE — Patient Instructions (Signed)
Call if bleeding persists or recurs. Call if cramping process or recurs.

## 2012-10-26 ENCOUNTER — Telehealth: Payer: Self-pay | Admitting: *Deleted

## 2012-10-26 NOTE — Telephone Encounter (Signed)
Pt informed with the below. And will call to follow up.

## 2012-10-26 NOTE — Telephone Encounter (Signed)
For the moment no treatment. I need to see her next week if it persists. However I would like her to call regardless on Monday and update me.

## 2012-10-26 NOTE — Telephone Encounter (Signed)
Pt is calling to let you about her cycle that has lasted about 6 days now, not heavy, medium flow, some cramping but motrin helps with that. She said her cycle normally would be about 3-4 days. She asked me to make you aware of this information. Please advise

## 2012-11-05 ENCOUNTER — Encounter: Payer: BC Managed Care – PPO | Admitting: Genetic Counselor

## 2012-11-05 ENCOUNTER — Other Ambulatory Visit: Payer: BC Managed Care – PPO | Admitting: Lab

## 2013-02-27 ENCOUNTER — Ambulatory Visit (INDEPENDENT_AMBULATORY_CARE_PROVIDER_SITE_OTHER): Payer: BC Managed Care – PPO | Admitting: Gynecology

## 2013-02-27 ENCOUNTER — Encounter: Payer: Self-pay | Admitting: Gynecology

## 2013-02-27 DIAGNOSIS — R1032 Left lower quadrant pain: Secondary | ICD-10-CM

## 2013-02-27 DIAGNOSIS — N951 Menopausal and female climacteric states: Secondary | ICD-10-CM

## 2013-02-27 DIAGNOSIS — N926 Irregular menstruation, unspecified: Secondary | ICD-10-CM

## 2013-02-27 NOTE — Progress Notes (Signed)
Former patient of Dr. Eda Paschal who presents complaining of left lower quadrant/groin pain. Followed bicycling episode. His perimenopausal with hot flashes and night sweats. Having sporadic menses over the past year. No prolonged or atypical bleeding. Had discussed options previously with Dr. Eda Paschal is not interested in treatment such as HRT. She is even concerned about taking soy based products and does not want to do that either. No urinary symptoms such as frequency dysuria urgency or low back pain. Regular bowel movements without constipation or diarrhea. No upper GI symptoms such as nausea vomiting diarrhea constipation.  Exam with Selena Batten assistant Spine straight without CVA tenderness Abdomen soft nontender without masses guarding rebound organomegaly active bowel sounds throughout. Pelvic external BUS vagina with atrophic changes. No evidence of hernia in the groin or labial region. Cervix normal. Uterus normal size midline mobile nontender. Adnexa without masses or tenderness. Rectovaginal exam is normal.  Assessment and plan: 1. Left groin pain felt secondary to musculoskeletal pull. No evidence of hernia or other issues. She appear has a history of ovarian cysts in the past and is concerned about that. I recommend we do a baseline ultrasound just to assess ovarian status and make sure that there is no issue of air and she agrees with this. We'll also check baseline urinalysis. Recommend heat to the area and ibuprofen. Assuming ultrasound negative if she persists in her discomfort will consider orthopedic referral. 2. Menopausal symptoms. I again discussed options to include observation, OTC soy based, HRT. The WHI study with risks of stroke heart attack DVT of breast cancer reviewed. Patient is not interested in doing anything at this time. Will keep menstrual calendar. As long she has less frequent but regular menses then will monitor. If she has prolonged or atypical bleeding or she goes more  than one year without bleeding and then bleeds she knows to  represent for evaluation.

## 2013-02-27 NOTE — Patient Instructions (Signed)
Follow up for ultrasound as scheduled 

## 2013-02-28 LAB — URINALYSIS W MICROSCOPIC + REFLEX CULTURE
Bacteria, UA: NONE SEEN
Bilirubin Urine: NEGATIVE
Crystals: NONE SEEN
Hgb urine dipstick: NEGATIVE
Ketones, ur: NEGATIVE mg/dL
Nitrite: NEGATIVE
Protein, ur: NEGATIVE mg/dL
Urobilinogen, UA: 0.2 mg/dL (ref 0.0–1.0)

## 2013-03-01 LAB — URINE CULTURE: Colony Count: 35000

## 2013-03-04 ENCOUNTER — Other Ambulatory Visit: Payer: Self-pay | Admitting: Gynecology

## 2013-03-04 MED ORDER — AMPICILLIN 500 MG PO CAPS: 500.0000 mg | ORAL_CAPSULE | Freq: Four times a day (QID) | ORAL | Status: DC

## 2013-03-15 ENCOUNTER — Ambulatory Visit (INDEPENDENT_AMBULATORY_CARE_PROVIDER_SITE_OTHER): Payer: BC Managed Care – PPO | Admitting: Gynecology

## 2013-03-15 ENCOUNTER — Encounter: Payer: Self-pay | Admitting: Gynecology

## 2013-03-15 ENCOUNTER — Ambulatory Visit (INDEPENDENT_AMBULATORY_CARE_PROVIDER_SITE_OTHER): Payer: BC Managed Care – PPO

## 2013-03-15 DIAGNOSIS — R102 Pelvic and perineal pain: Secondary | ICD-10-CM

## 2013-03-15 DIAGNOSIS — R1032 Left lower quadrant pain: Secondary | ICD-10-CM

## 2013-03-15 DIAGNOSIS — N949 Unspecified condition associated with female genital organs and menstrual cycle: Secondary | ICD-10-CM

## 2013-03-15 NOTE — Patient Instructions (Signed)
If pain persists I would follow up with orthopedics.

## 2013-03-15 NOTE — Progress Notes (Signed)
Patient presents for ultrasound due to history of left pelvic pain thought to be probably be orthopedic in her groin from exercise/bicycling. Ultrasound was ordered to rule out nonpalpable abnormalities.  Ultrasound shows uterus normal size and echotexture. Endometrial echo 4.3 mm. Right and left ovaries visualized and normal. Cul-de-sac negative.  Assessment and plan: Left groin/pelvic pain felt to be orthopedic. Patient will apply heat, nonsteroidal anti-inflammatories. If pain persists I recommend she follow up with orthopedics.

## 2013-04-03 ENCOUNTER — Telehealth: Payer: Self-pay | Admitting: *Deleted

## 2013-04-03 MED ORDER — URIBEL 118 MG PO CAPS: 118.0000 mg | ORAL_CAPSULE | Freq: Four times a day (QID) | ORAL | Status: DC | PRN

## 2013-04-03 NOTE — Telephone Encounter (Signed)
Okay, call in a prescription for uribel 4 times daily as needed #30. Remind her to use sunscreen and have fun.

## 2013-04-03 NOTE — Telephone Encounter (Signed)
Patient will see you as provider now. Annual due in October, she will be leaving for the [place] and asked if you would be willing to give her a rx for uribel to take with her. Pt said the [place] does not have this medication. Please advise if okay.

## 2013-04-03 NOTE — Telephone Encounter (Signed)
Pt informed with the below note. rx sent 

## 2013-04-22 ENCOUNTER — Encounter: Payer: Self-pay | Admitting: Gynecology

## 2013-08-22 ENCOUNTER — Other Ambulatory Visit (HOSPITAL_COMMUNITY)
Admission: RE | Admit: 2013-08-22 | Discharge: 2013-08-22 | Disposition: A | Discharge status: Home / Self Care | Payer: BC Managed Care – PPO | Source: Ambulatory Visit | Attending: Gynecology | Admitting: Gynecology

## 2013-08-22 ENCOUNTER — Encounter: Payer: Self-pay | Admitting: Women's Health

## 2013-08-22 ENCOUNTER — Ambulatory Visit (INDEPENDENT_AMBULATORY_CARE_PROVIDER_SITE_OTHER): Payer: BC Managed Care – PPO | Admitting: Women's Health

## 2013-08-22 VITALS — BP 110/72 | Ht 64.25 in | Wt 115.0 lb

## 2013-08-22 DIAGNOSIS — Z01419 Encounter for gynecological examination (general) (routine) without abnormal findings: Secondary | ICD-10-CM

## 2013-08-22 DIAGNOSIS — E079 Disorder of thyroid, unspecified: Secondary | ICD-10-CM

## 2013-08-22 DIAGNOSIS — Z833 Family history of diabetes mellitus: Secondary | ICD-10-CM

## 2013-08-22 LAB — CBC WITH DIFFERENTIAL/PLATELET
Basophils Absolute: 0 10*3/uL (ref 0.0–0.1)
Eosinophils Relative: 2 % (ref 0–5)
Lymphocytes Relative: 28 % (ref 12–46)
MCV: 90.8 fL (ref 78.0–100.0)
Neutrophils Relative %: 62 % (ref 43–77)
Platelets: 252 10*3/uL (ref 150–400)
RBC: 3.82 MIL/uL — ABNORMAL LOW (ref 3.87–5.11)
RDW: 14.6 % (ref 11.5–15.5)
WBC: 5 10*3/uL (ref 4.0–10.5)

## 2013-08-22 LAB — GLUCOSE, RANDOM: Glucose, Bld: 89 mg/dL (ref 70–99)

## 2013-08-22 MED ORDER — LEVOTHYROXINE SODIUM 112 MCG PO TABS: 112.0000 ug | ORAL_TABLET | Freq: Every day | ORAL | Status: DC

## 2013-08-22 NOTE — Patient Instructions (Signed)
Health Recommendations for Postmenopausal Women Respected and ongoing research has looked at the most common causes of death, disability, and poor quality of life in postmenopausal women. The causes include heart disease, diseases of blood vessels, diabetes, depression, cancer, and bone loss (osteoporosis). Many things can be done to help lower the chances of developing these and other common problems: CARDIOVASCULAR DISEASE Heart Disease: A heart attack is a medical emergency. Know the signs and symptoms of a heart attack. Below are things women can do to reduce their risk for heart disease.   Do not smoke. If you smoke, quit.  Aim for a healthy weight. Being overweight causes many preventable deaths. Eat a healthy and balanced diet and drink an adequate amount of liquids.  Get moving. Make a commitment to be more physically active. Aim for 30 minutes of activity on most, if not all days of the week.  Eat for heart health. Choose a diet that is low in saturated fat and cholesterol and eliminate trans fat. Include whole grains, vegetables, and fruits. Read and understand the labels on food containers before buying.  Know your numbers. Ask your caregiver to check your blood pressure, cholesterol (total, HDL, LDL, triglycerides) and blood glucose. Work with your caregiver on improving your entire clinical picture.  High blood pressure. Limit or stop your table salt intake (try salt substitute and food seasonings). Avoid salty foods and drinks. Read labels on food containers before buying. Eating well and exercising can help control high blood pressure. STROKE  Stroke is a medical emergency. Stroke may be the result of a blood clot in a blood vessel in the brain or by a brain hemorrhage (bleeding). Know the signs and symptoms of a stroke. To lower the risk of developing a stroke:  Avoid fatty foods.  Quit smoking.  Control your diabetes, blood pressure, and irregular heart rate. THROMBOPHLEBITIS  (BLOOD CLOT) OF THE LEG  Becoming overweight and leading a stationary lifestyle may also contribute to developing blood clots. Controlling your diet and exercising will help lower the risk of developing blood clots. CANCER SCREENING  Breast Cancer: Take steps to reduce your risk of breast cancer.  You should practice "breast self-awareness." This means understanding the normal appearance and feel of your breasts and should include breast self-examination. Any changes detected, no matter how small, should be reported to your caregiver.  After age 40, you should have a clinical breast exam (CBE) every year.  Starting at age 40, you should consider having a mammogram (breast X-ray) every year.  If you have a family history of breast cancer, talk to your caregiver about genetic screening.  If you are at high risk for breast cancer, talk to your caregiver about having an MRI and a mammogram every year.  Intestinal or Stomach Cancer: Tests to consider are a rectal exam, fecal occult blood, sigmoidoscopy, and colonoscopy. Women who are high risk may need to be screened at an earlier age and more often.  Cervical Cancer:  Beginning at age 30, you should have a Pap test every 3 years as long as the past 3 Pap tests have been normal.  If you have had past treatment for cervical cancer or a condition that could lead to cancer, you need Pap tests and screening for cancer for at least 20 years after your treatment.  If you had a hysterectomy for a problem that was not cancer or a condition that could lead to cancer, then you no longer need Pap tests.    If you are between ages 65 and 70, and you have had normal Pap tests going back 10 years, you no longer need Pap tests.  If Pap tests have been discontinued, risk factors (such as a new sexual partner) need to be reassessed to determine if screening should be resumed.  Some medical problems can increase the chance of getting cervical cancer. In these  cases, your caregiver may recommend more frequent screening and Pap tests.  Uterine Cancer: If you have vaginal bleeding after reaching menopause, you should notify your caregiver.  Ovarian cancer: Other than yearly pelvic exams, there are no reliable tests available to screen for ovarian cancer at this time except for yearly pelvic exams.  Lung Cancer: Yearly chest X-rays can detect lung cancer and should be done on high risk women, such as cigarette smokers and women with chronic lung disease (emphysema).  Skin Cancer: A complete body skin exam should be done at your yearly examination. Avoid overexposure to the sun and ultraviolet light lamps. Use a strong sun block cream when in the sun. All of these things are important in lowering the risk of skin cancer. MENOPAUSE Menopause Symptoms: Hormone therapy products are effective for treating symptoms associated with menopause:  Moderate to severe hot flashes.  Night sweats.  Mood swings.  Headaches.  Tiredness.  Loss of sex drive.  Insomnia.  Other symptoms. Hormone replacement carries certain risks, especially in older women. Women who use or are thinking about using estrogen or estrogen with progestin treatments should discuss that with their caregiver. Your caregiver will help you understand the benefits and risks. The ideal dose of hormone replacement therapy is not known. The Food and Drug Administration (FDA) has concluded that hormone therapy should be used only at the lowest doses and for the shortest amount of time to reach treatment goals.  OSTEOPOROSIS Protecting Against Bone Loss and Preventing Fracture: If you use hormone therapy for prevention of bone loss (osteoporosis), the risks for bone loss must outweigh the risk of the therapy. Ask your caregiver about other medications known to be safe and effective for preventing bone loss and fractures. To guard against bone loss or fractures, the following is recommended:  If  you are less than age 50, take 1000 mg of calcium and at least 600 mg of Vitamin D per day.  If you are greater than age 50 but less than age 70, take 1200 mg of calcium and at least 600 mg of Vitamin D per day.  If you are greater than age 70, take 1200 mg of calcium and at least 800 mg of Vitamin D per day. Smoking and excessive alcohol intake increases the risk of osteoporosis. Eat foods rich in calcium and vitamin D and do weight bearing exercises several times a week as your caregiver suggests. DIABETES Diabetes Melitus: If you have Type I or Type 2 diabetes, you should keep your blood sugar under control with diet, exercise and recommended medication. Avoid too many sweets, starchy and fatty foods. Being overweight can make control more difficult. COGNITION AND MEMORY Cognition and Memory: Menopausal hormone therapy is not recommended for the prevention of cognitive disorders such as Alzheimer's disease or memory loss.  DEPRESSION  Depression may occur at any age, but is common in elderly women. The reasons may be because of physical, medical, social (loneliness), or financial problems and needs. If you are experiencing depression because of medical problems and control of symptoms, talk to your caregiver about this. Physical activity and   exercise may help with mood and sleep. Community and volunteer involvement may help your sense of value and worth. If you have depression and you feel that the problem is getting worse or becoming severe, talk to your caregiver about treatment options that are best for you. ACCIDENTS  Accidents are common and can be serious in the elderly woman. Prepare your house to prevent accidents. Eliminate throw rugs, place hand bars in the bath, shower and toilet areas. Avoid wearing high heeled shoes or walking on wet, snowy, and icy areas. Limit or stop driving if you have vision or hearing problems, or you feel you are unsteady with you movements and  reflexes. HEPATITIS C Hepatitis C is a type of viral infection affecting the liver. It is spread mainly through contact with blood from an infected person. It can be treated, but if left untreated, it can lead to severe liver damage over years. Many people who are infected do not know that the virus is in their blood. If you are a "baby-boomer", it is recommended that you have one screening test for Hepatitis C. IMMUNIZATIONS  Several immunizations are important to consider having during your senior years, including:   Tetanus, diptheria, and pertussis booster shot.  Influenza every year before the flu season begins.  Pneumonia vaccine.  Shingles vaccine.  Others as indicated based on your specific needs. Talk to your caregiver about these. Document Released: 12/09/2005 Document Revised: 10/03/2012 Document Reviewed: 08/04/2008 [employer] Patient Information 2014 [employer].  

## 2013-08-22 NOTE — Addendum Note (Signed)
Addended by: WILKINSON, KARI S on: 08/22/2013 02:49 PM   Modules accepted: Orders  

## 2013-08-22 NOTE — Progress Notes (Signed)
ZIYON CEDOTAL 1966/07/26 [id number]    History:    The patient presents for annual exam.  3-4 day cycle every 3-4 months, FSH 132 03/2012. Negative endometrial biopsy 07/2012. Menopausal symptoms fluctuate. Denies bleeding between cycles. Hypothyroid on Synthroid. Normal Pap and mammogram history. 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 Paternal grandmother breast cancer.   Past medical history, past surgical history, family history and social history were all reviewed and documented in the EPIC chart. 9 year old son planning to attend [employer] next year, 49 year old twins and 49 year old all attend [employer] all doing well. Stay-at-home mom.   ROS:  A  ROS was performed and pertinent positives and negatives are included in the history.  Exam:  Filed Vitals:   08/22/13 1109  BP: 110/72    General appearance:  Normal Head/Neck:  Normal, without cervical or supraclavicular adenopathy. Thyroid:  Symmetrical, normal in size, without palpable masses or nodularity. Respiratory  Effort:  Normal  Auscultation:  Clear without wheezing or rhonchi Cardiovascular  Auscultation:  Regular rate, without rubs, murmurs or gallops  Edema/varicosities:  Not grossly evident Abdominal  Soft,nontender, without masses, guarding or rebound.  Liver/spleen:  No organomegaly noted  Hernia:  None appreciated  Skin  Inspection:  Grossly normal  Palpation:  Grossly normal Neurologic/psychiatric  Orientation:  Normal with appropriate conversation.  Mood/affect:  Normal  Genitourinary    Breasts: Examined lying and sitting.     Right: Without masses, retractions, discharge or axillary adenopathy.     Left: Without masses, retractions, discharge or axillary adenopathy.   Inguinal/mons:  Normal without inguinal adenopathy  External genitalia:  Normal  BUS/Urethra/Skene's glands:  Normal  Bladder:  Normal  Vagina:  Normal  Cervix:  Normal  Uterus:   normal in size, shape and contour.  Midline and  mobile  Adnexa/parametria:     Rt: Without masses or tenderness.   Lt: Without masses or tenderness.  Anus and perineum: Normal  Digital rectal exam: Normal sphincter tone without palpated masses or tenderness  Assessment/Plan:  47 y.o. MWF G3P4 for annual exam with complaint of fatigue, being extra thirsty, occasional tingling and hands and feet.  Perimenopausal Hypothyroid  Plan: CBC, glucose, TSH, UA, Pap. Pap normal 2013, new screening guidelines reviewed, requested repeat. Excellent lipid panel 2013. SBE's, continue annual mammogram had 3-D, encouraged to continue history of dense breast. Continue regular exercise, healthy lifestyle, calcium rich diet, vitamin D 2000 daily. Denies need for HRT for menopausal symptoms, Synthroid 112 mcg prescription, proper use given and reviewed.    Harrington Challenger WHNP, 1:18 PM 08/22/2013

## 2013-08-23 LAB — URINALYSIS W MICROSCOPIC + REFLEX CULTURE
Bilirubin Urine: NEGATIVE
Casts: NONE SEEN
Crystals: NONE SEEN
Glucose, UA: NEGATIVE mg/dL
Hgb urine dipstick: NEGATIVE
Ketones, ur: NEGATIVE mg/dL
Leukocytes, UA: NEGATIVE
Protein, ur: NEGATIVE mg/dL
Specific Gravity, Urine: <1.005 — ABNORMAL LOW (ref 1.005–1.030)
Squamous Epithelial / LPF: NONE SEEN
pH: 6.5 (ref 5.0–8.0)

## 2013-09-30 ENCOUNTER — Encounter: Payer: Self-pay | Admitting: Gynecology

## 2013-09-30 ENCOUNTER — Ambulatory Visit (INDEPENDENT_AMBULATORY_CARE_PROVIDER_SITE_OTHER): Payer: BC Managed Care – PPO | Admitting: Gynecology

## 2013-09-30 VITALS — BP 116/70

## 2013-09-30 DIAGNOSIS — R300 Dysuria: Secondary | ICD-10-CM

## 2013-09-30 DIAGNOSIS — N390 Urinary tract infection, site not specified: Secondary | ICD-10-CM

## 2013-09-30 DIAGNOSIS — N898 Other specified noninflammatory disorders of vagina: Secondary | ICD-10-CM

## 2013-09-30 LAB — URINALYSIS W MICROSCOPIC + REFLEX CULTURE
Casts: NONE SEEN
Crystals: NONE SEEN
Glucose, UA: NEGATIVE mg/dL
Ketones, ur: NEGATIVE mg/dL
Specific Gravity, Urine: 1.015 (ref 1.005–1.030)
Urobilinogen, UA: 0.2 mg/dL (ref 0.0–1.0)
pH: 7 (ref 5.0–8.0)

## 2013-09-30 LAB — WET PREP FOR TRICH, YEAST, CLUE
Clue Cells Wet Prep HPF POC: NONE SEEN
WBC, Wet Prep HPF POC: NONE SEEN
Yeast Wet Prep HPF POC: NONE SEEN

## 2013-09-30 MED ORDER — URIBEL 118 MG PO CAPS: 118.0000 mg | ORAL_CAPSULE | Freq: Four times a day (QID) | ORAL | Status: DC

## 2013-09-30 MED ORDER — NITROFURANTOIN MONOHYD MACRO 100 MG PO CAPS: 100.0000 mg | ORAL_CAPSULE | Freq: Two times a day (BID) | ORAL | Status: DC

## 2013-09-30 NOTE — Progress Notes (Signed)
47 year old patient that presented to the office complaining of several day history of suprapubic pressure and some mild cramping but no true dysuria. Patient thought she might have a light discharge. She is using condoms for contraception. Patient in a monogamous relationshipPatient had a fever chills nausea or vomiting. Patient had some Uribel which she took at home to alleviate her symptoms.  Exam: Abdomen soft nontender no rebound guarding  Back: No CVA tenderness Pelvic: Bartholin urethra Skene glands within normal limits Vagina: No lesions discharge cervix:  Physical Exam  Genitourinary:       Wet prep: Negative urinalysis: 7-10 WBC, 3-6 RBC and few bacteria  Assessment/plan: Signs and symptoms highly suspicious for urinary tract infection. Patient will be prescribed Macrobid 1 by mouth twice a day for 7 days. Patient will return tothe office in 1-to 2 weeks for colposcopic evaluation of this leukoplakic area noted on her cervix. Patient had a normal Pap smear October of this year. She will use Monistat each bedtime for 7 days.

## 2013-09-30 NOTE — Patient Instructions (Signed)
Colposcopy Colposcopy is a procedure to examine your cervix and vagina, or the area around the outside of your vagina, for abnormalities or signs of disease. The procedure is done using a lighted microscope called a colposcope. Tissue samples may be collected during the colposcopy if your health care provider finds any unusual cells. A colposcopy may be done if a woman has:  An abnormal Pap test. A Pap test is a medical test done to evaluate cells that are on the surface of the cervix.  A Pap test result that is suggestive of human papillomavirus (HPV). This virus can cause genital warts and is linked to the development of cervical cancer.  A sore on her cervix and the results of a Pap test were normal.  Genital warts on the cervix or in or around the outside of the vagina.  A mother who took the drug diethylstilbestrol (DES) while pregnant.  Painful intercourse.  Vaginal bleeding, especially after sexual intercourse. LET [facility] CARE PROVIDER KNOW ABOUT:  Any allergies you have.  All medicines you are taking, including vitamins, herbs, eye drops, creams, and over-the-counter medicines.  Previous problems you or members of your family have had with the use of anesthetics.  Any blood disorders you have.  Previous surgeries you have had.  Medical conditions you have. RISKS AND COMPLICATIONS Generally, a colposcopy is a safe procedure. However, as with any procedure, complications can occur. Possible complications include:  Bleeding.  Infection.  Missed lesions. BEFORE THE PROCEDURE   Tell your health care provider if you have your menstrual period. A colposcopy typically is not done during menstruation.  For 24 hours before the colposcopy, do not:  [place].  Use tampons.  Use medicines, creams, or suppositories in the vagina.  Have sexual intercourse. PROCEDURE  During the procedure, you will be lying on your back with your feet in foot rests (stirrups). A warm  metal or plastic instrument (speculum) will be placed in your vagina to keep it open and to allow the health care provider to see the cervix. The colposcope will be placed outside the vagina. It will be used to magnify and examine the cervix, vagina, and the area around the outside of the vagina. A small amount of liquid solution will be placed on the area that is to be viewed. This solution will make it easier to see the abnormal cells. Your health care provider will use tools to suck out mucus and cells from the canal of the cervix. Then he or she will record the location of the abnormal areas. If a biopsy is done during the procedure, a medicine will usually be given to numb the area (local anesthetic). You may feel mild pain or cramping while the biopsy is done. After the procedure, tissue samples collected during the biopsy will be sent to a lab for analysis. AFTER THE PROCEDURE  You will be given instructions on when to follow up with your health care provider for your test results. It is important to keep your appointment. Document Released: 01/07/2003 Document Revised: 06/19/2013 Document Reviewed: 05/16/2013 [facility] Patient Information 2014 [place], [place]. Urinary Tract Infection Urinary tract infections (UTIs) can develop anywhere along your urinary tract. Your urinary tract is your body's drainage system for removing wastes and extra water. Your urinary tract includes two kidneys, two ureters, a bladder, and a urethra. Your kidneys are a pair of bean-shaped organs. Each kidney is about the size of your fist. They are located below your ribs, one on each  side of your spine. CAUSES Infections are caused by microbes, which are microscopic organisms, including fungi, viruses, and bacteria. These organisms are so small that they can only be seen through a microscope. Bacteria are the microbes that most commonly cause UTIs. SYMPTOMS  Symptoms of UTIs may vary by age and gender of the patient and by  the location of the infection. Symptoms in young women typically include a frequent and intense urge to urinate and a painful, burning feeling in the bladder or urethra during urination. Older women and men are more likely to be tired, shaky, and weak and have muscle aches and abdominal pain. A fever may mean the infection is in your kidneys. Other symptoms of a kidney infection include pain in your back or sides below the ribs, nausea, and vomiting. DIAGNOSIS To diagnose a UTI, your caregiver will ask you about your symptoms. Your caregiver also will ask to provide a urine sample. The urine sample will be tested for bacteria and white blood cells. White blood cells are made by your body to help fight infection. TREATMENT  Typically, UTIs can be treated with medication. Because most UTIs are caused by a bacterial infection, they usually can be treated with the use of antibiotics. The choice of antibiotic and length of treatment depend on your symptoms and the type of bacteria causing your infection. HOME CARE INSTRUCTIONS  If you were prescribed antibiotics, take them exactly as your caregiver instructs you. Finish the medication even if you feel better after you have only taken some of the medication.  Drink enough water and fluids to keep your urine clear or pale yellow.  Avoid caffeine, tea, and carbonated beverages. They tend to irritate your bladder.  Empty your bladder often. Avoid holding urine for long periods of time.  Empty your bladder before and after sexual intercourse.  After a bowel movement, women should cleanse from front to back. Use each tissue only once. SEEK MEDICAL CARE IF:   You have back pain.  You develop a fever.  Your symptoms do not begin to resolve within 3 days. SEEK IMMEDIATE MEDICAL CARE IF:   You have severe back pain or lower abdominal pain.  You develop chills.  You have nausea or vomiting.  You have continued burning or discomfort with  urination. MAKE SURE YOU:   Understand these instructions.  Will watch your condition.  Will get help right away if you are not doing well or get worse. Document Released: 07/27/2005 Document Revised: 04/17/2012 Document Reviewed: 11/25/2011 [facility] Patient Information 2014 [place], [place].

## 2013-10-01 ENCOUNTER — Telehealth: Payer: Self-pay | Admitting: *Deleted

## 2013-10-01 NOTE — Telephone Encounter (Signed)
Continue with medication and increase fluid intake. Cranberry juice also will help. We should be getting culture results this week.

## 2013-10-01 NOTE — Telephone Encounter (Signed)
Pt was seen yesterday treated for UTI with Macrobid 100 mg x 7 days and uribel 118 mg. Pt has taken 4 Macrobid pill and still feels "feeling" c/o pressure still and overall not feeling well. She took some uribel as well, pt asked if she should just continue medication to see if some relief should come? Recommendations? Please advise

## 2013-10-01 NOTE — Telephone Encounter (Signed)
Left message on pt voicemail

## 2013-10-02 LAB — URINE CULTURE: Colony Count: >=100000

## 2013-10-15 ENCOUNTER — Encounter: Payer: Self-pay | Admitting: Gynecology

## 2013-10-15 ENCOUNTER — Ambulatory Visit (INDEPENDENT_AMBULATORY_CARE_PROVIDER_SITE_OTHER): Payer: BC Managed Care – PPO | Admitting: Gynecology

## 2013-10-15 VITALS — BP 122/78

## 2013-10-15 DIAGNOSIS — N390 Urinary tract infection, site not specified: Secondary | ICD-10-CM

## 2013-10-15 DIAGNOSIS — N889 Noninflammatory disorder of cervix uteri, unspecified: Secondary | ICD-10-CM

## 2013-10-15 DIAGNOSIS — R350 Frequency of micturition: Secondary | ICD-10-CM

## 2013-10-15 LAB — URINALYSIS W MICROSCOPIC + REFLEX CULTURE
Hgb urine dipstick: NEGATIVE
Leukocytes, UA: NEGATIVE
Nitrite: NEGATIVE
Specific Gravity, Urine: 1.01 (ref 1.005–1.030)
Urobilinogen, UA: 0.2 mg/dL (ref 0.0–1.0)
pH: 6.5 (ref 5.0–8.0)

## 2013-10-15 NOTE — Patient Instructions (Signed)
Colposcopy  Colposcopy is a procedure to examine your cervix and vagina, or the area around the outside of your vagina, for abnormalities or signs of disease. The procedure is done using a lighted microscope called a colposcope. Tissue samples may be collected during the colposcopy if your health care provider finds any unusual cells. A colposcopy may be done if a woman has:   An abnormal Pap test. A Pap test is a medical test done to evaluate cells that are on the surface of the cervix.   A Pap test result that is suggestive of human papillomavirus (HPV). This virus can cause genital warts and is linked to the development of cervical cancer.   A sore on her cervix and the results of a Pap test were normal.   Genital warts on the cervix or in or around the outside of the vagina.   A mother who took the drug diethylstilbestrol (DES) while pregnant.   Painful intercourse.   Vaginal bleeding, especially after sexual intercourse.  LET YOUR HEALTH CARE PROVIDER KNOW ABOUT:   Any allergies you have.   All medicines you are taking, including vitamins, herbs, eye drops, creams, and over-the-counter medicines.   Previous problems you or members of your family have had with the use of anesthetics.   Any blood disorders you have.   Previous surgeries you have had.   Medical conditions you have.  RISKS AND COMPLICATIONS  Generally, a colposcopy is a safe procedure. However, as with any procedure, complications can occur. Possible complications include:   Bleeding.   Infection.   Missed lesions.  BEFORE THE PROCEDURE    Tell your health care provider if you have your menstrual period. A colposcopy typically is not done during menstruation.   For 24 hours before the colposcopy, do not:   Douche.   Use tampons.   Use medicines, creams, or suppositories in the vagina.   Have sexual intercourse.  PROCEDURE   During the procedure, you will be lying on your back with your feet in foot rests (stirrups). A warm  metal or plastic instrument (speculum) will be placed in your vagina to keep it open and to allow the health care provider to see the cervix. The colposcope will be placed outside the vagina. It will be used to magnify and examine the cervix, vagina, and the area around the outside of the vagina. A small amount of liquid solution will be placed on the area that is to be viewed. This solution will make it easier to see the abnormal cells. Your health care provider will use tools to suck out mucus and cells from the canal of the cervix. Then he or she will record the location of the abnormal areas.  If a biopsy is done during the procedure, a medicine will usually be given to numb the area (local anesthetic). You may feel mild pain or cramping while the biopsy is done. After the procedure, tissue samples collected during the biopsy will be sent to a lab for analysis.  AFTER THE PROCEDURE   You will be given instructions on when to follow up with your health care provider for your test results. It is important to keep your appointment.  Document Released: 01/07/2003 Document Revised: 06/19/2013 Document Reviewed: 05/16/2013  [employer] Patient Information 2014 [employer].

## 2013-10-15 NOTE — Progress Notes (Signed)
Patient presented to the office today for colposcopic evaluation as a result of an incidental finding at time of exam 09/30/2013. Patient had been seen in the office on that day with signs and symptoms suspicious for urinary tract infection and was placed on Macrobid for 7 days. A leukoplakic area had been noted on the cervix and she was started on Monistat each bedtime for 7 days and asked to return today for further evaluation. Her Pap smear was normal and the patient states that she has never had any history of abnormal Pap smears. She's currently perimenopausal and has had menstrual cycle every 3-4 months despite elevated FSH which was tested last year.  Colposcopic evaluation today as follows: Physical Exam  Genitourinary:    the leukoplakic area was biopsied with a Kevorkian biopsy instrument. An ECC was also obtained. Silver nitrate was used for hemostasis.  Followup urinalysis today was negative  Assessment/plan: Leukoplakic area of the cervix was biopsied today despite normal recent Pap smear. Suspect hyperkeratosis. Will await pathology report and manage accordingly.

## 2013-10-23 ENCOUNTER — Emergency Department (HOSPITAL_COMMUNITY)
Admission: EM | Admit: 2013-10-23 | Discharge: 2013-10-23 | Disposition: A | Discharge status: Home / Self Care | Payer: BC Managed Care – PPO | Attending: Emergency Medicine | Admitting: Emergency Medicine

## 2013-10-23 ENCOUNTER — Encounter (HOSPITAL_COMMUNITY): Payer: Self-pay | Admitting: Emergency Medicine

## 2013-10-23 DIAGNOSIS — S81009A Unspecified open wound, unspecified knee, initial encounter: Secondary | ICD-10-CM | POA: Insufficient documentation

## 2013-10-23 DIAGNOSIS — W07XXXA Fall from chair, initial encounter: Secondary | ICD-10-CM | POA: Insufficient documentation

## 2013-10-23 DIAGNOSIS — Z87891 Personal history of nicotine dependence: Secondary | ICD-10-CM | POA: Insufficient documentation

## 2013-10-23 DIAGNOSIS — Z91040 Latex allergy status: Secondary | ICD-10-CM | POA: Insufficient documentation

## 2013-10-23 DIAGNOSIS — Z8739 Personal history of other diseases of the musculoskeletal system and connective tissue: Secondary | ICD-10-CM | POA: Insufficient documentation

## 2013-10-23 DIAGNOSIS — W268XXA Contact with other sharp object(s), not elsewhere classified, initial encounter: Secondary | ICD-10-CM | POA: Insufficient documentation

## 2013-10-23 DIAGNOSIS — Z792 Long term (current) use of antibiotics: Secondary | ICD-10-CM | POA: Insufficient documentation

## 2013-10-23 DIAGNOSIS — Y93G9 Activity, other involving cooking and grilling: Secondary | ICD-10-CM | POA: Insufficient documentation

## 2013-10-23 DIAGNOSIS — E039 Hypothyroidism, unspecified: Secondary | ICD-10-CM | POA: Insufficient documentation

## 2013-10-23 DIAGNOSIS — Y9339 Activity, other involving climbing, rappelling and jumping off: Secondary | ICD-10-CM | POA: Insufficient documentation

## 2013-10-23 DIAGNOSIS — IMO0002 Reserved for concepts with insufficient information to code with codable children: Secondary | ICD-10-CM

## 2013-10-23 DIAGNOSIS — Y9289 Other specified places as the place of occurrence of the external cause: Secondary | ICD-10-CM | POA: Insufficient documentation

## 2013-10-23 DIAGNOSIS — Z79899 Other long term (current) drug therapy: Secondary | ICD-10-CM | POA: Insufficient documentation

## 2013-10-23 DIAGNOSIS — Z8541 Personal history of malignant neoplasm of cervix uteri: Secondary | ICD-10-CM | POA: Insufficient documentation

## 2013-10-23 NOTE — ED Notes (Signed)
2 cm "gash"-laceration to l/anterior, lower leg. Bleeding controlled with pressure and ice. Pt was standing on a chair and fell. Denies dizziness ofr impact to head

## 2013-10-23 NOTE — ED Provider Notes (Signed)
CSN: [id number]     Arrival date & time 10/23/13  1535 History  This chart was scribed for non-physician practitioner Arthor Captain, working with Toy Baker, MD by Nicholos Johns, ED scribe. This patient was seen in room WTR8/WTR8 and the patient's care was started at 4:57 PM.  Chief Complaint  Patient presents with  . Fall  . Extremity Laceration    2 cm laceration on lower leg. bleeding controlled   Patient is a 47 y.o. female presenting with skin laceration. The history is provided by the patient. No language interpreter was used.  Laceration Location:  Leg Leg laceration location:  L lower leg Depth:  Cutaneous Quality: jagged   Bleeding: controlled   Pain details:    Severity:  No pain Worsened by:  Pressure Ineffective treatments:  None tried Tetanus status:  Up to date  HPI Comments: Kylie Kelley is a 47 y.o. female who presents to the Emergency Department complaining of fall around 2:30 PM today. Pt was climbed onto a chair while cooking to grab something, fell and landed on her dog but in the process cut her leg on a wooden chair. Pt immediately began to bleed but bleeding has since been controlled. There is an apparent jagged, laceration to the lower leg.  Pt denies pain with movement. Pts last tetanus was in August 2014.  Past Medical History  Diagnosis Date  . CIN I (cervical intraepithelial neoplasia I) 1990    CRYO  . Hypothyroid   . Osteopenia 02/2012    T score 1.1 FRAX 2.8%/0.2   Past Surgical History  Procedure Laterality Date  . Nose surgery    . Mandible surgery    . Cesarean section      X2  . Colposcopy    . Gynecologic cryosurgery     Family History  Problem Relation Age of Onset  . Breast cancer Paternal Grandmother     Age 80's   History  Substance Use Topics  . Smoking status: Former Games developer  . Smokeless tobacco: Not on file  . Alcohol Use: 1.0 oz/week    2 drink(s) per week     Comment: occas   OB History   Grav Para  Term Preterm Abortions TAB SAB Ect Mult Living   6 4   2  2   4      Review of Systems  Musculoskeletal: Negative for gait problem and joint swelling.  Skin: Positive for wound.       Laceration to the left shin  Neurological: Negative for numbness.  Hematological: Does not bruise/bleed easily.    Allergies  Latex  Home Medications   Current Outpatient Rx  Name  Route  Sig  Dispense  Refill  . amoxicillin (AMOXIL) 500 MG capsule   Oral   Take 500 mg by mouth 2 (two) times daily.         . Ascorbic Acid (VITAMIN C PO)   Oral   Take 1 tablet by mouth daily.          Marland Kitchen levothyroxine (SYNTHROID, LEVOTHROID) 112 MCG tablet   Oral   Take 1 tablet (112 mcg total) by mouth daily.   30 tablet   12   . Prenatal Vit-Fe Sulfate-FA (PRENATAL VITAMIN PO)   Oral   Take 1 tablet by mouth daily.           Triage Vitals: BP 110/80  Pulse 80  Temp(Src) 98.1 F (36.7 C) (Oral)  Resp 18  Wt  112 lb (50.803 kg)  SpO2 100% Physical Exam  Nursing note and vitals reviewed. Constitutional: She is oriented to person, place, and time. She appears well-developed and well-nourished. No distress.  HENT:  Head: Normocephalic and atraumatic.  Eyes: Conjunctivae and EOM are normal.  Neck: Neck supple. No tracheal deviation present.  Cardiovascular: Normal rate.   Pulmonary/Chest: Effort normal. No respiratory distress.  Musculoskeletal: Normal range of motion.  Neurological: She is alert and oriented to person, place, and time.  Skin: Skin is warm and dry.  3 cm superficial laceration on the left shin. Bleeding well controlled.  Psychiatric: She has a normal mood and affect. Her behavior is normal.    ED Course  Procedures (including critical care time) DIAGNOSTIC STUDIES: Oxygen Saturation is 100% on room air, normal by my interpretation.    COORDINATION OF CARE: At 4:57 PM: Discussed treatment plan with patient which includes suturing of the laceration. Patient agrees.    LACERATION REPAIR Performed by: Arthor Captain Consent: Verbal consent obtained. Risks and benefits: risks, benefits and alternatives were discussed Patient identity confirmed: provided demographic data Time out performed prior to procedure Prepped and Draped in normal sterile fashion Wound explored Laceration Location: left shin Laceration Length: 3 cm No Foreign Bodies seen or palpated Anesthesia: local infiltration Local anesthetic: lidocaine 1% w/ epinephrine Anesthetic total: 3 ml Irrigation method: syringe Amount of cleaning: standard Skin closure: 5-0 ethilon Number of sutures or staples: 4 Technique: simple interrupted Patient tolerance: Patient tolerated the procedure well with no immediate complications.  Labs Review Labs Reviewed - No data to display Imaging Review No results found.  EKG Interpretation   None       MDM   1. Laceration    .Pressure irrigation performed. Laceration occurred < 8 hours prior to repair which was well tolerated. Pt has no co morbidities to effect normal wound healing. Discussed suture home care w pt and answered questions. Pt to f-u for wound check and suture removal in 7 days. Pt is hemodynamically stable w no complaints prior to dc.    I personally performed the services described in this documentation, which was scribed in my presence. The recorded information has been reviewed and is accurate.       Arthor Captain, PA-C 10/24/13 [phone]

## 2013-10-24 NOTE — ED Provider Notes (Signed)
EKG Interpretation   None        Toy Baker, MD 10/24/13 2308

## 2014-03-04 ENCOUNTER — Ambulatory Visit (INDEPENDENT_AMBULATORY_CARE_PROVIDER_SITE_OTHER): Payer: BC Managed Care – PPO | Admitting: Women's Health

## 2014-03-04 ENCOUNTER — Encounter: Payer: Self-pay | Admitting: Women's Health

## 2014-03-04 DIAGNOSIS — R300 Dysuria: Secondary | ICD-10-CM

## 2014-03-04 DIAGNOSIS — N912 Amenorrhea, unspecified: Secondary | ICD-10-CM

## 2014-03-04 LAB — URINALYSIS W MICROSCOPIC + REFLEX CULTURE
Bilirubin Urine: NEGATIVE
Glucose, UA: NEGATIVE mg/dL
HGB URINE DIPSTICK: NEGATIVE
Ketones, ur: NEGATIVE mg/dL
LEUKOCYTES UA: NEGATIVE
NITRITE: NEGATIVE
Protein, ur: NEGATIVE mg/dL
Specific Gravity, Urine: <1.005 — ABNORMAL LOW (ref 1.005–1.030)
Urobilinogen, UA: 0.2 mg/dL (ref 0.0–1.0)
pH: 7 (ref 5.0–8.0)

## 2014-03-04 NOTE — Progress Notes (Signed)
Patient ID: Kylie Kelley, female   DOB: Jul 02, 1966, 48 y.o.   MRN: [id number] Presents with complaint of questionable UTI,  increased frequency with nocturia, pressure, cramping. Denies any vaginal discharge. Amenorrheic with elevated FSH. Denies pain with urination, fever.  Exam: Appears well. UA: Negative  Urinary frequency  Plan: Urine culture pending. Reviewed importance of drinking more fluids during the day, less in the evening to prevent nocturia.

## 2014-03-05 LAB — URINE CULTURE: Colony Count: >=100000

## 2014-03-06 ENCOUNTER — Encounter: Payer: Self-pay | Admitting: Women's Health

## 2014-04-03 ENCOUNTER — Ambulatory Visit (INDEPENDENT_AMBULATORY_CARE_PROVIDER_SITE_OTHER): Payer: BC Managed Care – PPO | Admitting: Gynecology

## 2014-04-03 ENCOUNTER — Other Ambulatory Visit (HOSPITAL_COMMUNITY)
Admission: RE | Admit: 2014-04-03 | Discharge: 2014-04-03 | Disposition: A | Discharge status: Home / Self Care | Payer: BC Managed Care – PPO | Source: Ambulatory Visit | Attending: Gynecology | Admitting: Gynecology

## 2014-04-03 ENCOUNTER — Encounter: Payer: Self-pay | Admitting: Gynecology

## 2014-04-03 DIAGNOSIS — N951 Menopausal and female climacteric states: Secondary | ICD-10-CM

## 2014-04-03 DIAGNOSIS — Z01419 Encounter for gynecological examination (general) (routine) without abnormal findings: Secondary | ICD-10-CM | POA: Insufficient documentation

## 2014-04-03 DIAGNOSIS — Z87410 Personal history of cervical dysplasia: Secondary | ICD-10-CM

## 2014-04-03 DIAGNOSIS — Z1151 Encounter for screening for human papillomavirus (HPV): Secondary | ICD-10-CM | POA: Insufficient documentation

## 2014-04-03 DIAGNOSIS — N952 Postmenopausal atrophic vaginitis: Secondary | ICD-10-CM

## 2014-04-03 DIAGNOSIS — R87619 Unspecified abnormal cytological findings in specimens from cervix uteri: Secondary | ICD-10-CM

## 2014-04-03 NOTE — Patient Instructions (Signed)
Hormone Therapy At menopause, your body begins making less estrogen and progesterone hormones. This causes the body to stop having menstrual periods. This is because estrogen and progesterone hormones control your periods and menstrual cycle. A lack of estrogen may cause symptoms such as:  Hot flushes (or hot flashes).  Vaginal dryness.  Dry skin.  Loss of sex drive.  Risk of bone loss (osteoporosis). When this happens, you may choose to take hormone therapy to get back the estrogen lost during menopause. When the hormone estrogen is given alone, it is usually referred to as ET (Estrogen Therapy). When the hormone progestin is combined with estrogen, it is generally called HT (Hormone Therapy). This was formerly known as hormone replacement therapy (HRT). Your caregiver can help you make a decision on what will be best for you. The decision to use HT seems to change often as new studies are done. Many studies do not agree on the benefits of hormone replacement therapy. LIKELY BENEFITS OF HT INCLUDE PROTECTION FROM:  Hot Flushes (also called hot flashes) - A hot flush is a sudden feeling of heat that spreads over the face and body. The skin may redden like a blush. It is connected with sweats and sleep disturbance. Women going through menopause may have hot flushes a few times a month or several times per day depending on the woman.  Osteoporosis (bone loss)- Estrogen helps guard against bone loss. After menopause, a woman's bones slowly lose calcium and become weak and brittle. As a result, bones are more likely to break. The hip, wrist, and spine are affected most often. Hormone therapy can help slow bone loss after menopause. Weight bearing exercise and taking calcium with vitamin D also can help prevent bone loss. There are also medications that your caregiver can prescribe that can help prevent osteoporosis.  Vaginal Dryness - Loss of estrogen causes changes in the vagina. Its lining may  become thin and dry. These changes can cause pain and bleeding during sexual intercourse. Dryness can also lead to infections. This can cause burning and itching. (Vaginal estrogen treatment can help relieve pain, itching, and dryness.)  Urinary Tract Infections are more common after menopause because of lack of estrogen. Some women also develop urinary incontinence because of low estrogen levels in the vagina and bladder.  Possible other benefits of estrogen include a positive effect on mood and short-term memory in women. RISKS AND COMPLICATIONS  Using estrogen alone without progesterone causes the lining of the uterus to grow. This increases the risk of lining of the uterus (endometrial) cancer. Your caregiver should give another hormone called progestin if you have a uterus.  Women who take combined (estrogen and progestin) HT appear to have an increased risk of breast cancer. The risk appears to be small, but increases throughout the time that HT is taken.  Combined therapy also makes the breast tissue slightly denser which makes it harder to read mammograms (breast X-rays).  Combined, estrogen and progesterone therapy can be taken together every day, in which case there may be spotting of blood. HT therapy can be taken cyclically in which case you will have menstrual periods. Cyclically means HT is taken for a set amount of days, then not taken, then this process is repeated.  HT may increase the risk of stroke, heart attack, breast cancer and forming blood clots in your leg.  Transdermal estrogen (estrogen that is absorbed through the skin with a patch or a cream) may have more positive results with:    Cholesterol.  Blood pressure.  Blood clots. Having the following conditions may indicate you should not have HT:  Endometrial cancer.  Liver disease.  Breast cancer.  Heart disease.  History of blood clots.  Stroke. TREATMENT   If you choose to take HT and have a uterus,  usually estrogen and progestin are prescribed.  Your caregiver will help you decide the best way to take the medications.  Possible ways to take estrogen include:  Pills.  Patches.  Gels.  Sprays.  Vaginal estrogen cream, rings and tablets.  It is best to take the lowest dose possible that will help your symptoms and take them for the shortest period of time that you can.  Hormone therapy can help relieve some of the problems (symptoms) that affect women at menopause. Before making a decision about HT, talk to your caregiver about what is best for you. Be well informed and comfortable with your decisions. HOME CARE INSTRUCTIONS   Follow your caregivers advice when taking the medications.  A Pap test is done to screen for cervical cancer.  The first Pap test should be done at age 15.  Between ages 65 and 68, Pap tests are repeated every 2 years.  Beginning at age 27, you are advised to have a Pap test every 3 years as long as your past 3 Pap tests have been normal.  Some women have medical problems that increase the chance of getting cervical cancer. Talk to your caregiver about these problems. It is especially important to talk to your caregiver if a new problem develops soon after your last Pap test. In these cases, your caregiver may recommend more frequent screening and Pap tests.  The above recommendations are the same for women who have or have not gotten the vaccine for HPV (Human Papillomavirus).  If you had a hysterectomy for a problem that was not a cancer or a condition that could lead to cancer, then you no longer need Pap tests. However, even if you no longer need a Pap test, a regular exam is a good idea to make sure no other problems are starting.   If you are between ages 58 and 56, and you have had normal Pap tests going back 10 years, you no longer need Pap tests. However, even if you no longer need a Pap test, a regular exam is a good idea to make sure no  other problems are starting.   If you have had past treatment for cervical cancer or a condition that could lead to cancer, you need Pap tests and screening for cancer for at least 20 years after your treatment.  If Pap tests have been discontinued, risk factors (such as a new sexual partner) need to be re-assessed to determine if screening should be resumed.  Some women may need screenings more often if they are at high risk for cervical cancer.  Get mammograms done as per the advice of your caregiver. SEEK IMMEDIATE MEDICAL CARE IF:  You develop abnormal vaginal bleeding.  You have pain or swelling in your legs, shortness of breath, or chest pain.  You develop dizziness or headaches.  You have lumps or changes in your breasts or armpits.  You have slurred speech.  You develop weakness or numbness of your arms or legs.  You have pain, burning, or bleeding when urinating.  You develop abdominal pain. Document Released: 07/16/2003 Document Revised: 01/09/2012 Document Reviewed: 11/03/2010 [facility] Patient Information 2014 [place], [place]. Menopause Menopause is the normal time  of life when menstrual periods stop completely. Menopause is complete when you have missed 12 consecutive menstrual periods. It usually occurs between the ages of 43 years and 80 years. Very rarely does a woman develop menopause before the age of 39 years. At menopause, your ovaries stop producing the female hormones estrogen and progesterone. This can cause undesirable symptoms and also affect your health. Sometimes the symptoms may occur 4 5 years before the menopause begins. There is no relationship between menopause and:  Oral contraceptives.  Number of children you had.  Race.  The age your menstrual periods started (menarche). Heavy smokers and very thin women may develop menopause earlier in life. CAUSES  The ovaries stop producing the female hormones estrogen and progesterone.  Other causes  include:  Surgery to remove both ovaries.  The ovaries stop functioning for no known reason.  Tumors of the pituitary gland in the brain.  Medical disease that affects the ovaries and hormone production.  Radiation treatment to the abdomen or pelvis.  Chemotherapy that affects the ovaries. SYMPTOMS   Hot flashes.  Night sweats.  Decrease in sex drive.  Vaginal dryness and thinning of the vagina causing painful intercourse.  Dryness of the skin and developing wrinkles.  Headaches.  Tiredness.  Irritability.  Memory problems.  Weight gain.  Bladder infections.  Hair growth of the face and chest.  Infertility. More serious symptoms include:  Loss of bone (osteoporosis) causing breaks (fractures).  Depression.  Hardening and narrowing of the arteries (atherosclerosis) causing heart attacks and strokes. DIAGNOSIS   When the menstrual periods have stopped for 12 straight months.  Physical exam.  Hormone studies of the blood. TREATMENT  There are many treatment choices and nearly as many questions about them. The decisions to treat or not to treat menopausal changes is an individual choice made with your health care provider. Your health care provider can discuss the treatments with you. Together, you can decide which treatment will work best for you. Your treatment choices may include:   Hormone therapy (estrogen and progesterone).  Non-hormonal medicines.  Treating the individual symptoms with medicine (for example antidepressants for depression).  Herbal medicines that may help specific symptoms.  Counseling by a psychiatrist or psychologist.  Group therapy.  Lifestyle changes including:  Eating healthy.  Regular exercise.  Limiting caffeine and alcohol.  Stress management and meditation.  No treatment. HOME CARE INSTRUCTIONS   Take the medicine your health care provider gives you as directed.  Get plenty of sleep and rest.  Exercise  regularly.  Eat a diet that contains calcium (good for the bones) and soy products (acts like estrogen hormone).  Avoid alcoholic beverages.  Do not smoke.  If you have hot flashes, dress in layers.  Take supplements, calcium, and vitamin D to strengthen bones.  You can use over-the-counter lubricants or moisturizers for vaginal dryness.  Group therapy is sometimes very helpful.  Acupuncture may be helpful in some cases. SEEK MEDICAL CARE IF:   You are not sure you are in menopause.  You are having menopausal symptoms and need advice and treatment.  You are still having menstrual periods after age 35 years.  You have pain with intercourse.  Menopause is complete (no menstrual period for 12 months) and you develop vaginal bleeding.  You need a referral to a specialist (gynecologist, psychiatrist, or psychologist) for treatment. SEEK IMMEDIATE MEDICAL CARE IF:   You have severe depression.  You have excessive vaginal bleeding.  You fell and think  you have a broken bone.  You have pain when you urinate.  You develop leg or chest pain.  You have a fast pounding heart beat (palpitations).  You have severe headaches.  You develop vision problems.  You feel a lump in your breast.  You have abdominal pain or severe indigestion. Document Released: 01/07/2004 Document Revised: 06/19/2013 Document Reviewed: 05/16/2013 [employer] Patient Information 2014 [employer].  

## 2014-04-03 NOTE — Progress Notes (Signed)
   Patient presented to the office today for followup Pap smear. Patient a recent pelvic examination was noted to have leukoplakic area of the ectocervix from the 11 to the 7:00 position and underwent colposcopic directed biopsy 10/15/2013 with a final pathology result:  Diagnosis 1. Endocervical biopsy - SCANT DETACHED BENIGN ENDOCERVICAL AND SQUAMOUS MUCOSAL FRAGMENTS. - NO DYSPLASIA, ATYPIA OR MALIGNANCY IDENTIFIED. 2. Cervix, biopsy, 10 o'clock - BENIGN SQUAMOUS MUCOSA WITH HYPERKERATOSIS. - NO DYSPLASIA OR MALIGNANCY IDENTIFIED. - TRANSFORMATION ZONE MUCOSA IS NOT PRESENT  Pap smear was repeated today. Patient for her scheduled annual exam at the end of the year. Patient with evidence of vaginal atrophy and friable vaginal mucosa. Discussed today HRT versus topical and ligature information was provided.

## 2014-04-03 NOTE — Addendum Note (Signed)
Addended by: Thurnell Garbe A on: 04/03/2014 11:32 AM   Modules accepted: Orders

## 2014-04-04 LAB — CYTOLOGY - PAP

## 2014-04-24 ENCOUNTER — Encounter: Payer: Self-pay | Admitting: Women's Health

## 2014-08-26 ENCOUNTER — Ambulatory Visit (INDEPENDENT_AMBULATORY_CARE_PROVIDER_SITE_OTHER): Payer: BC Managed Care – PPO | Admitting: Women's Health

## 2014-08-26 ENCOUNTER — Encounter: Payer: Self-pay | Admitting: Women's Health

## 2014-08-26 VITALS — BP 110/70 | Ht 64.0 in | Wt 113.0 lb

## 2014-08-26 DIAGNOSIS — E038 Other specified hypothyroidism: Secondary | ICD-10-CM

## 2014-08-26 DIAGNOSIS — N870 Mild cervical dysplasia: Secondary | ICD-10-CM

## 2014-08-26 MED ORDER — LEVOTHYROXINE SODIUM 112 MCG PO TABS: 112.0000 ug | ORAL_TABLET | Freq: Every day | ORAL | Status: DC

## 2014-08-26 NOTE — Progress Notes (Signed)
Kylie Kelley [birth year] [id number]    History:    Presents for annual exam.  Postmenopausal/no HRT/no bleeding. [place] 132. Had cycled every 3-4 months prior to year stopping. 03/2014 normal Pap with negative HR HPV. 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 Normal mammogram history. Negative endometrial biopsy 07/2012 for irregular bleeding. Hypothyroid on Synthroid. Scheduled for screening colonoscopy (having done at the [facility]).  Past medical history, past surgical history, family history and social history were all reviewed and documented in the EPIC chart. 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  ROS:  A  12 point ROS was performed and pertinent positives and negatives are included.  Exam:  Filed Vitals:   08/26/14 1115  BP: 110/70    General appearance:  Normal Thyroid:  Symmetrical, normal in size, without palpable masses or nodularity. Respiratory  Auscultation:  Clear without wheezing or rhonchi Cardiovascular  Auscultation:  Regular rate, without rubs, murmurs or gallops  Edema/varicosities:  Not grossly evident Abdominal  Soft,nontender, without masses, guarding or rebound.  Liver/spleen:  No organomegaly noted  Hernia:  None appreciated  Skin  Inspection:  Grossly normal   Breasts: Examined lying and sitting.     Right: Without masses, retractions, discharge or axillary adenopathy.     Left: Without masses, retractions, discharge or axillary adenopathy. Gentitourinary   Inguinal/mons:  Normal without inguinal adenopathy  External genitalia:  Normal  BUS/Urethra/Skene's glands:  Normal  Vagina:  Normal  Cervix:  Normal  Uterus:   normal in size, shape and contour.  Midline and mobile  Adnexa/parametria:     Rt: Without masses or tenderness.   Lt: Without masses or tenderness.  Anus and perineum: Normal  Digital rectal exam: Normal sphincter tone without palpated masses or  tenderness  Assessment/Plan:  48 y.o. MWF G6P4 for annual exam with no complaints.   Postmenopausal/ no HRT/no bleeding with minimal menopausal symptoms Hypothyroid on Synthroid 1990 CIN-1/cryo-with normal Paps after  Plan: SBE's, continue annual 3-D tomography mammogram history of dense breasts, calcium rich diet, vitamin D 2000. Continue healthy lifestyle with regular exercise, bone density next year. Keep scheduled follow-up for colonoscopy. Had numerous labs, reports all normal at screening for colonoscopy, normal TSH, will continue on Synthroid 112. Pap normal with Negative HR HPV l 03/2014, new screening guidelines reviewed.  [name] [facility], 12:35 PM 08/26/2014

## 2014-08-26 NOTE — Patient Instructions (Signed)
Health Recommendations for Postmenopausal Women Respected and ongoing research has looked at the most common causes of death, disability, and poor quality of life in postmenopausal women. The causes include heart disease, diseases of blood vessels, diabetes, depression, cancer, and bone loss (osteoporosis). Many things can be done to help lower the chances of developing these and other common problems. CARDIOVASCULAR DISEASE Heart Disease: A heart attack is a medical emergency. Know the signs and symptoms of a heart attack. Below are things women can do to reduce their risk for heart disease.   Do not smoke. If you smoke, quit.  Aim for a healthy weight. Being overweight causes many preventable deaths. Eat a healthy and balanced diet and drink an adequate amount of liquids.  Get moving. Make a commitment to be more physically active. Aim for 30 minutes of activity on most, if not all days of the week.  Eat for heart health. Choose a diet that is low in saturated fat and cholesterol and eliminate trans fat. Include whole grains, vegetables, and fruits. Read and understand the labels on food containers before buying.  Know your numbers. Ask your caregiver to check your blood pressure, cholesterol (total, HDL, LDL, triglycerides) and blood glucose. Work with your caregiver on improving your entire clinical picture.  High blood pressure. Limit or stop your table salt intake (try salt substitute and food seasonings). Avoid salty foods and drinks. Read labels on food containers before buying. Eating well and exercising can help control high blood pressure. STROKE  Stroke is a medical emergency. Stroke may be the result of a blood clot in a blood vessel in the brain or by a brain hemorrhage (bleeding). Know the signs and symptoms of a stroke. To lower the risk of developing a stroke:  Avoid fatty foods.  Quit smoking.  Control your diabetes, blood pressure, and irregular heart rate. THROMBOPHLEBITIS  (BLOOD CLOT) OF THE LEG  Becoming overweight and leading a stationary lifestyle may also contribute to developing blood clots. Controlling your diet and exercising will help lower the risk of developing blood clots. CANCER SCREENING  Breast Cancer: Take steps to reduce your risk of breast cancer.  You should practice "breast self-awareness." This means understanding the normal appearance and feel of your breasts and should include breast self-examination. Any changes detected, no matter how small, should be reported to your caregiver.  After age 40, you should have a clinical breast exam (CBE) every year.  Starting at age 40, you should consider having a mammogram (breast X-ray) every year.  If you have a family history of breast cancer, talk to your caregiver about genetic screening.  If you are at high risk for breast cancer, talk to your caregiver about having an MRI and a mammogram every year.  Intestinal or Stomach Cancer: Tests to consider are a rectal exam, fecal occult blood, sigmoidoscopy, and colonoscopy. Women who are high risk may need to be screened at an earlier age and more often.  Cervical Cancer:  Beginning at age 30, you should have a Pap test every 3 years as long as the past 3 Pap tests have been normal.  If you have had past treatment for cervical cancer or a condition that could lead to cancer, you need Pap tests and screening for cancer for at least 20 years after your treatment.  If you had a hysterectomy for a problem that was not cancer or a condition that could lead to cancer, then you no longer need Pap tests.    If you are between ages 65 and 70, and you have had normal Pap tests going back 10 years, you no longer need Pap tests.  If Pap tests have been discontinued, risk factors (such as a new sexual partner) need to be reassessed to determine if screening should be resumed.  Some medical problems can increase the chance of getting cervical cancer. In these  cases, your caregiver may recommend more frequent screening and Pap tests.  Uterine Cancer: If you have vaginal bleeding after reaching menopause, you should notify your caregiver.  Ovarian Cancer: Other than yearly pelvic exams, there are no reliable tests available to screen for ovarian cancer at this time except for yearly pelvic exams.  Lung Cancer: Yearly chest X-rays can detect lung cancer and should be done on high risk women, such as cigarette smokers and women with chronic lung disease (emphysema).  Skin Cancer: A complete body skin exam should be done at your yearly examination. Avoid overexposure to the sun and ultraviolet light lamps. Use a strong sun block cream when in the sun. All of these things are important for lowering the risk of skin cancer. MENOPAUSE Menopause Symptoms: Hormone therapy products are effective for treating symptoms associated with menopause:  Moderate to severe hot flashes.  Night sweats.  Mood swings.  Headaches.  Tiredness.  Loss of sex drive.  Insomnia.  Other symptoms. Hormone replacement carries certain risks, especially in older women. Women who use or are thinking about using estrogen or estrogen with progestin treatments should discuss that with their caregiver. Your caregiver will help you understand the benefits and risks. The ideal dose of hormone replacement therapy is not known. The Food and Drug Administration (FDA) has concluded that hormone therapy should be used only at the lowest doses and for the shortest amount of time to reach treatment goals.  OSTEOPOROSIS Protecting Against Bone Loss and Preventing Fracture If you use hormone therapy for prevention of bone loss (osteoporosis), the risks for bone loss must outweigh the risk of the therapy. Ask your caregiver about other medications known to be safe and effective for preventing bone loss and fractures. To guard against bone loss or fractures, the following is recommended:  If  you are younger than age 50, take 1000 mg of calcium and at least 600 mg of Vitamin D per day.  If you are older than age 50 but younger than age 70, take 1200 mg of calcium and at least 600 mg of Vitamin D per day.  If you are older than age 70, take 1200 mg of calcium and at least 800 mg of Vitamin D per day. Smoking and excessive alcohol intake increases the risk of osteoporosis. Eat foods rich in calcium and vitamin D and do weight bearing exercises several times a week as your caregiver suggests. DIABETES Diabetes Mellitus: If you have type I or type 2 diabetes, you should keep your blood sugar under control with diet, exercise, and recommended medication. Avoid starchy and fatty foods, and too many sweets. Being overweight can make diabetes control more difficult. COGNITION AND MEMORY Cognition and Memory: Menopausal hormone therapy is not recommended for the prevention of cognitive disorders such as Alzheimer's disease or memory loss.  DEPRESSION  Depression may occur at any age, but it is common in elderly women. This may be because of physical, medical, social (loneliness), or financial problems and needs. If you are experiencing depression because of medical problems and control of symptoms, talk to your caregiver about this. Physical   activity and exercise may help with mood and sleep. Community and volunteer involvement may improve your sense of value and worth. If you have depression and you feel that the problem is getting worse or becoming severe, talk to your caregiver about which treatment options are best for you. ACCIDENTS  Accidents are common and can be serious in elderly woman. Prepare your house to prevent accidents. Eliminate throw rugs, place hand bars in bath, shower, and toilet areas. Avoid wearing high heeled shoes or walking on wet, snowy, and icy areas. Limit or stop driving if you have vision or hearing problems, or if you feel you are unsteady with your movements and  reflexes. HEPATITIS C Hepatitis C is a type of viral infection affecting the liver. It is spread mainly through contact with blood from an infected person. It can be treated, but if left untreated, it can lead to severe liver damage over the years. Many people who are infected do not know that the virus is in their blood. If you are a "baby-boomer", it is recommended that you have one screening test for Hepatitis C. IMMUNIZATIONS  Several immunizations are important to consider having during your senior years, including:   Tetanus, diphtheria, and pertussis booster shot.  Influenza every year before the flu season begins.  Pneumonia vaccine.  Shingles vaccine.  Others, as indicated based on your specific needs. Talk to your caregiver about these. Document Released: 12/09/2005 Document Revised: 03/03/2014 Document Reviewed: 08/04/2008 ExitCare Patient Information 2015 [employer]. This information is not intended to replace advice given to you by your health care provider. Make sure you discuss any questions you have with your health care provider.  

## 2014-09-01 ENCOUNTER — Encounter: Payer: Self-pay | Admitting: Women's Health

## 2014-12-31 ENCOUNTER — Ambulatory Visit (INDEPENDENT_AMBULATORY_CARE_PROVIDER_SITE_OTHER): Payer: BLUE CROSS/BLUE SHIELD | Admitting: Women's Health

## 2014-12-31 ENCOUNTER — Encounter: Payer: Self-pay | Admitting: Women's Health

## 2014-12-31 VITALS — BP 120/78 | Wt 118.0 lb

## 2014-12-31 DIAGNOSIS — N912 Amenorrhea, unspecified: Secondary | ICD-10-CM | POA: Diagnosis not present

## 2014-12-31 DIAGNOSIS — B373 Candidiasis of vulva and vagina: Secondary | ICD-10-CM | POA: Diagnosis not present

## 2014-12-31 DIAGNOSIS — R350 Frequency of micturition: Secondary | ICD-10-CM

## 2014-12-31 DIAGNOSIS — N898 Other specified noninflammatory disorders of vagina: Secondary | ICD-10-CM

## 2014-12-31 DIAGNOSIS — B3731 Acute candidiasis of vulva and vagina: Secondary | ICD-10-CM

## 2014-12-31 LAB — URINALYSIS W MICROSCOPIC + REFLEX CULTURE
Bilirubin Urine: NEGATIVE
Casts: NONE SEEN
Crystals: NONE SEEN
GLUCOSE, UA: NEGATIVE mg/dL
Hgb urine dipstick: NEGATIVE
Ketones, ur: NEGATIVE mg/dL
Nitrite: NEGATIVE
PH: 6.5 (ref 5.0–8.0)
Protein, ur: NEGATIVE mg/dL
Specific Gravity, Urine: 1.01 (ref 1.005–1.030)
Urobilinogen, UA: 0.2 mg/dL (ref 0.0–1.0)

## 2014-12-31 LAB — WET PREP FOR TRICH, YEAST, CLUE
CLUE CELLS WET PREP: NONE SEEN
TRICH WET PREP: NONE SEEN

## 2014-12-31 MED ORDER — FLUCONAZOLE 150 MG PO TABS: 150.0000 mg | ORAL_TABLET | Freq: Once | ORAL | Status: DC

## 2014-12-31 NOTE — Progress Notes (Signed)
Patient ID: Kylie Kelley, female   DOB: [birth year], 49 y.o.   MRN: [id number] Presents with 1 week history of increased urinary frequency, vaginal discharge with itching/irritation and occasional low abdominal cramping discomfort. Amenorrheic 1 year/condoms. Having some menopausal symptoms that are manageable. Denies odor, fever, pain or burning with urination.  Exam: Appears well. External genitalia erythematous at introitus, speculum exam scant white discharge mild atrophy, wet prep positive for yeast. Bimanual no CMT or adnexal fullness or tenderness. UA: Small leukocytes, 3-6 WBCs, few bacteria  Yeast vaginitis Menopause  Plan: FSH, menopause reviewed. Diflucan 150 by mouth 1 dose. Yeast prevention discussed. Encouraged vaginal lubricants with intercourse. Call if no relief of symptoms. Urine culture pending.

## 2014-12-31 NOTE — Patient Instructions (Signed)

## 2015-01-02 LAB — URINE CULTURE

## 2015-05-19 ENCOUNTER — Encounter: Payer: Self-pay | Admitting: Women's Health

## 2015-09-01 ENCOUNTER — Encounter: Payer: BLUE CROSS/BLUE SHIELD | Admitting: Women's Health

## 2015-09-01 ENCOUNTER — Other Ambulatory Visit: Payer: Self-pay | Admitting: *Deleted

## 2015-09-01 DIAGNOSIS — E038 Other specified hypothyroidism: Secondary | ICD-10-CM

## 2015-09-01 MED ORDER — LEVOTHYROXINE SODIUM 112 MCG PO TABS: 112.0000 ug | ORAL_TABLET | Freq: Every day | ORAL | Status: DC

## 2015-09-28 ENCOUNTER — Encounter: Payer: Self-pay | Admitting: Women's Health

## 2015-09-28 ENCOUNTER — Ambulatory Visit (INDEPENDENT_AMBULATORY_CARE_PROVIDER_SITE_OTHER): Payer: BLUE CROSS/BLUE SHIELD | Admitting: Women's Health

## 2015-09-28 ENCOUNTER — Other Ambulatory Visit (HOSPITAL_COMMUNITY)
Admission: RE | Admit: 2015-09-28 | Discharge: 2015-09-28 | Disposition: A | Discharge status: Home / Self Care | Payer: BLUE CROSS/BLUE SHIELD | Source: Ambulatory Visit | Attending: Gynecology | Admitting: Gynecology

## 2015-09-28 VITALS — BP 122/80 | Ht 64.0 in | Wt 113.0 lb

## 2015-09-28 DIAGNOSIS — E038 Other specified hypothyroidism: Secondary | ICD-10-CM | POA: Diagnosis not present

## 2015-09-28 DIAGNOSIS — Z13820 Encounter for screening for osteoporosis: Secondary | ICD-10-CM

## 2015-09-28 DIAGNOSIS — M899 Disorder of bone, unspecified: Secondary | ICD-10-CM

## 2015-09-28 DIAGNOSIS — M8580 Other specified disorders of bone density and structure, unspecified site: Secondary | ICD-10-CM

## 2015-09-28 DIAGNOSIS — Z01419 Encounter for gynecological examination (general) (routine) without abnormal findings: Secondary | ICD-10-CM | POA: Insufficient documentation

## 2015-09-28 DIAGNOSIS — Z13220 Encounter for screening for lipoid disorders: Secondary | ICD-10-CM | POA: Diagnosis not present

## 2015-09-28 LAB — URINALYSIS W MICROSCOPIC + REFLEX CULTURE
Bilirubin Urine: NEGATIVE
Casts: NONE SEEN [LPF]
Crystals: NONE SEEN [HPF]
GLUCOSE, UA: NEGATIVE
Ketones, ur: NEGATIVE
NITRITE: NEGATIVE
Protein, ur: NEGATIVE
Specific Gravity, Urine: 1.01 (ref 1.001–1.035)
Yeast: NONE SEEN [HPF]
pH: 5.5 (ref 5.0–8.0)

## 2015-09-28 MED ORDER — LEVOTHYROXINE SODIUM 112 MCG PO TABS: 112.0000 ug | ORAL_TABLET | Freq: Every day | ORAL | Status: DC

## 2015-09-28 NOTE — Progress Notes (Signed)
Kylie Kelley 1966-02-03 [id number]    History:    Presents for annual exam.  Postmenopausal/no bleeding/no HRT. 1990 CIN-1 cryo-with normal Paps after. Normal mammogram history. Hypothyroid. Colonoscopy scheduled next month.   Past medical history, past surgical history, family history and social history were all reviewed and documented in the EPIC chart. 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  ROS:  A ROS was performed and pertinent positives and negatives are included.  Exam:  Filed Vitals:   09/28/15 1413  BP: 122/80    General appearance:  Normal Thyroid:  Symmetrical, normal in size, questionable nodule right thyroid Respiratory  Auscultation:  Clear without wheezing or rhonchi Cardiovascular  Auscultation:  Regular rate, without rubs, murmurs or gallops  Edema/varicosities:  Not grossly evident Abdominal  Soft,nontender, without masses, guarding or rebound.  Liver/spleen:  No organomegaly noted  Hernia:  None appreciated  Skin  Inspection:  Grossly normal   Breasts: Examined lying and sitting.     Right: Without masses, retractions, discharge or axillary adenopathy.     Left: Without masses, retractions, discharge or axillary adenopathy. Gentitourinary   Inguinal/mons:  Normal without inguinal adenopathy  External genitalia:  Normal  BUS/Urethra/Skene's glands:  Normal  Vagina:  Normal  Cervix:  Normal  Uterus: normal in size, shape and contour.  Midline and mobile  Adnexa/parametria:     Rt: Without masses or tenderness.   Lt: Without masses or tenderness.  Anus and perineum: Normal  Digital rectal exam: Normal sphincter tone without palpated masses or tenderness  Assessment/Plan:  50 y.o. MWF G3 P4 for annual exam with no complaints.  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 Thyroid ultrasound for questionable  nodule on right thyroid. Continue healthy lifestyle of diet and exercise, calcium rich diet, vitamin D 1000 daily encouraged. SBE's, continue annual 3-D mammogram history of dense breasts. Repeat DEXA. Synthroid 112 prescription, proper use given and reviewed proper administration. CBC, CMP P, lipid panel, TSH, T3, T4, UA, Pap normal with negative HR HPV, requested repeat Pap, Pap.  [name] [facility], 5:16 PM 09/28/2015

## 2015-09-28 NOTE — Patient Instructions (Signed)

## 2015-09-29 ENCOUNTER — Telehealth: Payer: Self-pay | Admitting: *Deleted

## 2015-09-29 DIAGNOSIS — E041 Nontoxic single thyroid nodule: Secondary | ICD-10-CM

## 2015-09-29 LAB — COMPREHENSIVE METABOLIC PANEL
ALBUMIN: 4.8 g/dL (ref 3.6–5.1)
ALK PHOS: 59 U/L (ref 33–130)
ALT: 22 U/L (ref 6–29)
AST: 33 U/L (ref 10–35)
BILIRUBIN TOTAL: 0.6 mg/dL (ref 0.2–1.2)
BUN: 14 mg/dL (ref 7–25)
CALCIUM: 9.8 mg/dL (ref 8.6–10.4)
CO2: 30 mmol/L (ref 20–31)
Chloride: 103 mmol/L (ref 98–110)
Creat: 0.74 mg/dL (ref 0.50–1.05)
GLUCOSE: 101 mg/dL — AB (ref 65–99)
POTASSIUM: 3.8 mmol/L (ref 3.5–5.3)
Sodium: 139 mmol/L (ref 135–146)
Total Protein: 7 g/dL (ref 6.1–8.1)

## 2015-09-29 LAB — CBC WITH DIFFERENTIAL/PLATELET
Basophils Absolute: 0.1 10*3/uL (ref 0.0–0.1)
Basophils Relative: 1 % (ref 0–1)
Eosinophils Absolute: 0.1 10*3/uL (ref 0.0–0.7)
Eosinophils Relative: 2 % (ref 0–5)
HCT: 36.9 % (ref 36.0–46.0)
HEMOGLOBIN: 12.1 g/dL (ref 12.0–15.0)
LYMPHS ABS: 1.9 10*3/uL (ref 0.7–4.0)
LYMPHS PCT: 26 % (ref 12–46)
MCH: 30 pg (ref 26.0–34.0)
MCHC: 32.8 g/dL (ref 30.0–36.0)
MCV: 91.6 fL (ref 78.0–100.0)
MONO ABS: 0.4 10*3/uL (ref 0.1–1.0)
MPV: 10.2 fL (ref 8.6–12.4)
Monocytes Relative: 5 % (ref 3–12)
NEUTROS ABS: 4.8 10*3/uL (ref 1.7–7.7)
Neutrophils Relative %: 66 % (ref 43–77)
Platelets: 258 10*3/uL (ref 150–400)
RBC: 4.03 MIL/uL (ref 3.87–5.11)
RDW: 14.1 % (ref 11.5–15.5)
WBC: 7.2 10*3/uL (ref 4.0–10.5)

## 2015-09-29 LAB — LIPID PANEL
CHOL/HDL RATIO: 2 ratio (ref <=5.0)
Cholesterol: 193 mg/dL (ref 125–200)
HDL: 95 mg/dL (ref >=46)
LDL Cholesterol: 85 mg/dL (ref <130)
TRIGLYCERIDES: 63 mg/dL (ref <150)
VLDL: 13 mg/dL (ref <30)

## 2015-09-29 LAB — TSH: TSH: 3.316 u[IU]/mL (ref 0.350–4.500)

## 2015-09-29 LAB — T4: T4, Total: 5.2 ug/dL (ref 4.5–12.0)

## 2015-09-29 NOTE — Telephone Encounter (Signed)
-----   Message from [name], NP sent at 09/28/2015  5:28 PM EST ----- Please schedule thyroid ultrasound for questionable 1 cm nontender nodule on right side of thyroid. Hypothyroid on 112 Synthroid daily. Any day okay except Thursday. Also have her schedule a DEXA here, I forgot to tell her when she was here order is in the chart.

## 2015-09-29 NOTE — Telephone Encounter (Signed)
Appointment on 10/05/15 @ 9:15am at [facility] radiology, left message for pt to call.

## 2015-09-29 NOTE — Telephone Encounter (Signed)
Pt informed

## 2015-09-30 LAB — CYTOLOGY - PAP

## 2015-09-30 LAB — URINE CULTURE: Colony Count: 10000

## 2015-10-05 ENCOUNTER — Ambulatory Visit (HOSPITAL_COMMUNITY)
Admission: RE | Admit: 2015-10-05 | Discharge: 2015-10-05 | Disposition: A | Discharge status: Home / Self Care | Payer: BLUE CROSS/BLUE SHIELD | Source: Ambulatory Visit | Attending: Women's Health | Admitting: Women's Health

## 2015-10-05 DIAGNOSIS — E041 Nontoxic single thyroid nodule: Secondary | ICD-10-CM | POA: Diagnosis not present

## 2015-10-06 ENCOUNTER — Telehealth: Payer: Self-pay | Admitting: *Deleted

## 2015-10-06 ENCOUNTER — Ambulatory Visit (INDEPENDENT_AMBULATORY_CARE_PROVIDER_SITE_OTHER): Payer: BLUE CROSS/BLUE SHIELD

## 2015-10-06 ENCOUNTER — Other Ambulatory Visit: Payer: Self-pay | Admitting: Women's Health

## 2015-10-06 DIAGNOSIS — M8589 Other specified disorders of bone density and structure, multiple sites: Secondary | ICD-10-CM

## 2015-10-06 DIAGNOSIS — Z13820 Encounter for screening for osteoporosis: Secondary | ICD-10-CM

## 2015-10-06 DIAGNOSIS — M899 Disorder of bone, unspecified: Secondary | ICD-10-CM

## 2015-10-06 DIAGNOSIS — M8580 Other specified disorders of bone density and structure, unspecified site: Secondary | ICD-10-CM

## 2015-10-06 NOTE — Telephone Encounter (Signed)
Pt called with questions for 09/28/15 results, all questions answered

## 2015-10-08 ENCOUNTER — Other Ambulatory Visit: Payer: Self-pay | Admitting: *Deleted

## 2015-10-08 DIAGNOSIS — M8580 Other specified disorders of bone density and structure, unspecified site: Secondary | ICD-10-CM

## 2016-05-27 ENCOUNTER — Encounter: Payer: Self-pay | Admitting: Women's Health

## 2016-09-28 ENCOUNTER — Encounter: Payer: BLUE CROSS/BLUE SHIELD | Admitting: Women's Health

## 2016-10-05 ENCOUNTER — Other Ambulatory Visit: Payer: Self-pay

## 2016-10-05 DIAGNOSIS — E038 Other specified hypothyroidism: Secondary | ICD-10-CM

## 2016-10-05 MED ORDER — LEVOTHYROXINE SODIUM 112 MCG PO TABS: 112.0000 ug | ORAL_TABLET | Freq: Every day | ORAL | 0 refills | Status: DC

## 2016-10-07 ENCOUNTER — Encounter: Payer: BLUE CROSS/BLUE SHIELD | Admitting: Women's Health

## 2016-11-08 ENCOUNTER — Ambulatory Visit (INDEPENDENT_AMBULATORY_CARE_PROVIDER_SITE_OTHER): Payer: PRIVATE HEALTH INSURANCE | Admitting: Women's Health

## 2016-11-08 ENCOUNTER — Encounter: Payer: Self-pay | Admitting: Women's Health

## 2016-11-08 VITALS — BP 128/80 | Ht 64.0 in | Wt 116.0 lb

## 2016-11-08 DIAGNOSIS — Z01419 Encounter for gynecological examination (general) (routine) without abnormal findings: Secondary | ICD-10-CM

## 2016-11-08 DIAGNOSIS — E038 Other specified hypothyroidism: Secondary | ICD-10-CM | POA: Diagnosis not present

## 2016-11-08 LAB — TSH: TSH: 1.63 mIU/L

## 2016-11-08 NOTE — Progress Notes (Signed)
Kylie Kelley [birth year] [id number]    History:    Presents for annual exam.  Postmenopausal on no HRT with no bleeding. 1990 CIN-1 with normal Paps after. Negative colonoscopy this past year. 10/2015 T score -1.5 left femoral neck FRAX 4.4%/0.4%. Hypothyroid primary care manages. Paternal grandmother breast cancer.  Past medical history, past surgical history, family history and social history were all reviewed and documented in the EPIC chart. Homemaker. Twins 34, son 15, daughter 71 all doing well have received gardasil. Parents healthy. Going back to school for aesthetician.  ROS:  A ROS was performed and pertinent positives and negatives are included.  Exam:  Vitals:   11/08/16 0910  BP: 128/80  Weight: 116 lb (52.6 kg)  Height: 5\' 4"  (1.626 m)   Body mass index is 19.91 kg/m.   General appearance:  Normal Thyroid:  Symmetrical, normal in size, without palpable masses or nodularity. Respiratory  Auscultation:  Clear without wheezing or rhonchi Cardiovascular  Auscultation:  Regular rate, without rubs, murmurs or gallops  Edema/varicosities:  Not grossly evident Abdominal  Soft,nontender, without masses, guarding or rebound.  Liver/spleen:  No organomegaly noted  Hernia:  None appreciated  Skin  Inspection:  Grossly normal   Breasts: Examined lying and sitting.     Right: Without masses, retractions, discharge or axillary adenopathy.     Left: Without masses, retractions, discharge or axillary adenopathy. Gentitourinary   Inguinal/mons:  Normal without inguinal adenopathy  External genitalia:  Normal  BUS/Urethra/Skene's glands:  Normal  Vagina:  Normal  Cervix:  Normal  Uterus:  normal in size, shape and contour.  Midline and mobile  Adnexa/parametria:     Rt: Without masses or tenderness.   Lt: Without masses or tenderness.  Anus and perineum: Normal  Digital rectal exam: Normal sphincter tone without palpated masses or tenderness  Assessment/Plan:  51  y.o. MWF G6 P4  for annual exam with no complaints.  Postmenopausal/no HRT/no bleeding Vaginal atrophy Hypothyroid-primary care manages labs and meds Osteopenia without elevated FRAX 1990 CIN-1 normal Paps after  Plan: Options for vaginal atrophy reviewed will try over-the-counter [place]. SBE's, continue annual screening mammogram, calcium rich diet, vitamin D 2000 daily encouraged. Home safety, fall prevention and importance of weightbearing exercise reviewed. Continue healthy lifestyle. Pap normal 2016, requested Pap annually repeat Pap done.    [name] [facility], 12:35 PM 11/08/2016

## 2016-11-08 NOTE — Patient Instructions (Signed)

## 2016-11-09 ENCOUNTER — Encounter: Payer: Self-pay | Admitting: Women's Health

## 2016-11-09 LAB — PAP IG W/ RFLX HPV ASCU

## 2016-12-12 ENCOUNTER — Other Ambulatory Visit: Payer: Self-pay

## 2016-12-12 DIAGNOSIS — E038 Other specified hypothyroidism: Secondary | ICD-10-CM

## 2016-12-13 ENCOUNTER — Other Ambulatory Visit: Payer: Self-pay

## 2016-12-13 ENCOUNTER — Telehealth: Payer: Self-pay

## 2016-12-13 DIAGNOSIS — E038 Other specified hypothyroidism: Secondary | ICD-10-CM

## 2016-12-13 MED ORDER — LEVOTHYROXINE SODIUM 112 MCG PO TABS: 112.0000 ug | ORAL_TABLET | Freq: Every day | ORAL | 4 refills | Status: AC

## 2016-12-13 NOTE — Telephone Encounter (Signed)
Please call in refills for Synthroid 112 g daily #90 with 4 refills. We did check it at her annual exam and it was in the normal range.

## 2016-12-13 NOTE — Telephone Encounter (Signed)
Her pharmacy had sent refill request for her Levothyroxine 112 mcg and I denied it because your office note from 11/08/16 said her PCP was managing her hypothyroidism.  Patient called today about getting a refill. She said you checked her TSH at visit.  Please advise.

## 2016-12-14 NOTE — Telephone Encounter (Signed)
Rx sent 

## 2016-12-14 NOTE — Telephone Encounter (Signed)
Patient informed TSH normal and Rx sent.

## 2017-02-14 ENCOUNTER — Telehealth: Payer: Self-pay | Admitting: *Deleted

## 2017-02-14 NOTE — Telephone Encounter (Signed)
Pt called c/o lower quad  discomfort off and on x 1 week now, no vaginal bleeding. I advised pt to schedule OV with provider. Transferred to front desk to schedule.

## 2017-03-07 ENCOUNTER — Encounter: Payer: Self-pay | Admitting: Women's Health

## 2017-03-07 ENCOUNTER — Ambulatory Visit (INDEPENDENT_AMBULATORY_CARE_PROVIDER_SITE_OTHER): Payer: PRIVATE HEALTH INSURANCE | Admitting: Women's Health

## 2017-03-07 VITALS — BP 118/80 | Ht 64.0 in

## 2017-03-07 DIAGNOSIS — R1030 Lower abdominal pain, unspecified: Secondary | ICD-10-CM

## 2017-03-07 DIAGNOSIS — N898 Other specified noninflammatory disorders of vagina: Secondary | ICD-10-CM

## 2017-03-07 LAB — WET PREP FOR TRICH, YEAST, CLUE
Clue Cells Wet Prep HPF POC: NONE SEEN
Trich, Wet Prep: NONE SEEN
Yeast Wet Prep HPF POC: NONE SEEN

## 2017-03-07 NOTE — Progress Notes (Signed)
Presents with complaint of low abdominal cramping for the past month but has now resolved. Has had a change in routine over the last few months completed aesthetician school and was rarely exercising which was a change for her. Scant discharge, some vaginal dryness, postmenopausal on no HRT with no bleeding. Low abdominal pain was generalized but more so on the left side. Denies constipation or changes in bowel elimination, urinary symptoms, dyspareunia or fever. No changes in diet. 3 college aged children,1 daughter still in high school, numerous stressors with their lives and summer plans. Hypothyroid.  Exam: Appears well. Abdomen soft no rebound or radiation of pain, no CVAT. External genitalia within normal limits, speculum exam atrophic, wet prep negative. Bimanual no CMT or adnexal tenderness, no pain with exam.  Resolving low abdominal pain  Plan Will watch at this time, reviewed normality of wet prep and exam. Instructed to call if any bleeding or cramping/pain returns.

## 2017-03-15 ENCOUNTER — Encounter: Payer: Self-pay | Admitting: Gynecology

## 2017-05-16 ENCOUNTER — Encounter: Payer: Self-pay | Admitting: Women's Health

## 2017-07-05 ENCOUNTER — Telehealth: Payer: Self-pay | Admitting: *Deleted

## 2017-07-05 ENCOUNTER — Ambulatory Visit (INDEPENDENT_AMBULATORY_CARE_PROVIDER_SITE_OTHER): Payer: PRIVATE HEALTH INSURANCE | Admitting: Women's Health

## 2017-07-05 ENCOUNTER — Encounter: Payer: Self-pay | Admitting: Women's Health

## 2017-07-05 VITALS — BP 110/80

## 2017-07-05 DIAGNOSIS — R350 Frequency of micturition: Secondary | ICD-10-CM | POA: Diagnosis not present

## 2017-07-05 DIAGNOSIS — N898 Other specified noninflammatory disorders of vagina: Secondary | ICD-10-CM | POA: Diagnosis not present

## 2017-07-05 LAB — WET PREP FOR TRICH, YEAST, CLUE

## 2017-07-05 NOTE — Telephone Encounter (Signed)
Pt called c/o lower/upper back discomfort (played tennis this weekend) pressure, frequency, took Azo, took a diflucan tablet, discomfort with intercourse as well.  Questions if early UTI? Rx? Please advise

## 2017-07-05 NOTE — Telephone Encounter (Signed)
Pt informed, coming today at 3:20pm

## 2017-07-05 NOTE — Progress Notes (Signed)
Presents with complaint of increased urinary frequency, vaginal irritation, discomfort with intercourse for the past week.  Denies vaginal odor, itching, pain at end of stream of urination, abdominal pain or fever. Postmenopausal 2 years on no HRT with no bleeding. Hypothyroid.   Exam: Appears well. External genitalia mild erythema with mild atrophy, speculum exam vaginal walls atrophic, no visible discharge, wet prep negative. Bimanual no CMT or adnexal tenderness. UA: +1 leukocytes, 6-10 WBCs, 0-5 squamous epithelials, few bacteria  Postmenopausal vaginal atrophy  Plan: Urine culture pending. Reviewed negative wet prep, vaginal walls appear atrophic, options reviewed, declines vaginal estrogen. Encouraged vaginal lubricants with intercourse.

## 2017-07-05 NOTE — Telephone Encounter (Signed)
Pt called and left message in triage voicemail c/o UTI, left message for pt to call.

## 2017-07-05 NOTE — Telephone Encounter (Signed)
Office visit would be best to check a wet prep and UA.

## 2017-07-07 ENCOUNTER — Encounter: Payer: Self-pay | Admitting: Women's Health

## 2017-07-07 LAB — URINALYSIS W MICROSCOPIC + REFLEX CULTURE
Bilirubin Urine: NEGATIVE
CASTS: NONE SEEN /LPF
Crystals: NONE SEEN /HPF
GLUCOSE, UA: NEGATIVE
Hgb urine dipstick: NEGATIVE
Ketones, ur: NEGATIVE
NITRITES URINE, INITIAL: NEGATIVE
PROTEIN: NEGATIVE
Specific Gravity, Urine: 1.01 (ref 1.001–1.03)
Yeast: NONE SEEN /HPF
pH: 6 (ref 5.0–8.0)

## 2017-07-07 LAB — CULTURE INDICATED

## 2017-07-07 LAB — URINE CULTURE
MICRO NUMBER:: 80978731
SPECIMEN QUALITY: ADEQUATE

## 2017-11-14 ENCOUNTER — Ambulatory Visit (INDEPENDENT_AMBULATORY_CARE_PROVIDER_SITE_OTHER): Payer: PRIVATE HEALTH INSURANCE | Admitting: Women's Health

## 2017-11-14 ENCOUNTER — Encounter: Payer: Self-pay | Admitting: Women's Health

## 2017-11-14 VITALS — BP 110/78 | Ht 64.0 in | Wt 118.0 lb

## 2017-11-14 DIAGNOSIS — Z13820 Encounter for screening for osteoporosis: Secondary | ICD-10-CM

## 2017-11-14 DIAGNOSIS — Z01419 Encounter for gynecological examination (general) (routine) without abnormal findings: Secondary | ICD-10-CM | POA: Diagnosis not present

## 2017-11-14 NOTE — Patient Instructions (Signed)
Health Maintenance for Postmenopausal Women Menopause is a normal process in which your reproductive ability comes to an end. This process happens gradually over a span of months to years, usually between the ages of 38 and 66. Menopause is complete when you have missed 12 consecutive menstrual periods. It is important to talk with your health care provider about some of the most common conditions that affect postmenopausal women, such as heart disease, cancer, and bone loss (osteoporosis). Adopting a healthy lifestyle and getting preventive care can help to promote your health and wellness. Those actions can also lower your chances of developing some of these common conditions. What should I know about menopause? During menopause, you may experience a number of symptoms, such as:  Moderate-to-severe hot flashes.  Night sweats.  Decrease in sex drive.  Mood swings.  Headaches.  Tiredness.  Irritability.  Memory problems.  Insomnia.  Choosing to treat or not to treat menopausal changes is an individual decision that you make with your health care provider. What should I know about hormone replacement therapy and supplements? Hormone therapy products are effective for treating symptoms that are associated with menopause, such as hot flashes and night sweats. Hormone replacement carries certain risks, especially as you become older. If you are thinking about using estrogen or estrogen with progestin treatments, discuss the benefits and risks with your health care provider. What should I know about heart disease and stroke? Heart disease, heart attack, and stroke become more likely as you age. This may be due, in part, to the hormonal changes that your body experiences during menopause. These can affect how your body processes dietary fats, triglycerides, and cholesterol. Heart attack and stroke are both medical emergencies. There are many things that you can do to help prevent heart disease  and stroke:  Have your blood pressure checked at least every 1-2 years. High blood pressure causes heart disease and increases the risk of stroke.  If you are 71-26 years old, ask your health care provider if you should take aspirin to prevent a heart attack or a stroke.  Do not use any tobacco products, including cigarettes, chewing tobacco, or electronic cigarettes. If you need help quitting, ask your health care provider.  It is important to eat a healthy diet and maintain a healthy weight. ? Be sure to include plenty of vegetables, fruits, low-fat dairy products, and lean protein. ? Avoid eating foods that are high in solid fats, added sugars, or salt (sodium).  Get regular exercise. This is one of the most important things that you can do for your health. ? Try to exercise for at least 150 minutes each week. The type of exercise that you do should increase your heart rate and make you sweat. This is known as moderate-intensity exercise. ? Try to do strengthening exercises at least twice each week. Do these in addition to the moderate-intensity exercise.  Know your numbers.Ask your health care provider to check your cholesterol and your blood glucose. Continue to have your blood tested as directed by your health care provider.  What should I know about cancer screening? There are several types of cancer. Take the following steps to reduce your risk and to catch any cancer development as early as possible. Breast Cancer  Practice breast self-awareness. ? This means understanding how your breasts normally appear and feel. ? It also means doing regular breast self-exams. Let your health care provider know about any changes, no matter how small.  If you are 40  or older, have a clinician do a breast exam (clinical breast exam or CBE) every year. Depending on your age, family history, and medical history, it may be recommended that you also have a yearly breast X-ray (mammogram).  If you  have a family history of breast cancer, talk with your health care provider about genetic screening.  If you are at high risk for breast cancer, talk with your health care provider about having an MRI and a mammogram every year.  Breast cancer (BRCA) gene test is recommended for women who have family members with BRCA-related cancers. Results of the assessment will determine the need for genetic counseling and BRCA1 and for BRCA2 testing. BRCA-related cancers include these types: ? Breast. This occurs in males or females. ? Ovarian. ? Tubal. This may also be called fallopian tube cancer. ? Cancer of the abdominal or pelvic lining (peritoneal cancer). ? Prostate. ? Pancreatic.  Cervical, Uterine, and Ovarian Cancer Your health care provider may recommend that you be screened regularly for cancer of the pelvic organs. These include your ovaries, uterus, and vagina. This screening involves a pelvic exam, which includes checking for microscopic changes to the surface of your cervix (Pap test).  For women ages 21-65, health care providers may recommend a pelvic exam and a Pap test every three years. For women ages 79-65, they may recommend the Pap test and pelvic exam, combined with testing for human papilloma virus (HPV), every five years. Some types of HPV increase your risk of cervical cancer. Testing for HPV may also be done on women of any age who have unclear Pap test results.  Other health care providers may not recommend any screening for nonpregnant women who are considered low risk for pelvic cancer and have no symptoms. Ask your health care provider if a screening pelvic exam is right for you.  If you have had past treatment for cervical cancer or a condition that could lead to cancer, you need Pap tests and screening for cancer for at least 20 years after your treatment. If Pap tests have been discontinued for you, your risk factors (such as having a new sexual partner) need to be  reassessed to determine if you should start having screenings again. Some women have medical problems that increase the chance of getting cervical cancer. In these cases, your health care provider may recommend that you have screening and Pap tests more often.  If you have a family history of uterine cancer or ovarian cancer, talk with your health care provider about genetic screening.  If you have vaginal bleeding after reaching menopause, tell your health care provider.  There are currently no reliable tests available to screen for ovarian cancer.  Lung Cancer Lung cancer screening is recommended for adults 69-62 years old who are at high risk for lung cancer because of a history of smoking. A yearly low-dose CT scan of the lungs is recommended if you:  Currently smoke.  Have a history of at least 30 pack-years of smoking and you currently smoke or have quit within the past 15 years. A pack-year is smoking an average of one pack of cigarettes per day for one year.  Yearly screening should:  Continue until it has been 15 years since you quit.  Stop if you develop a health problem that would prevent you from having lung cancer treatment.  Colorectal Cancer  This type of cancer can be detected and can often be prevented.  Routine colorectal cancer screening usually begins at  age 42 and continues through age 45.  If you have risk factors for colon cancer, your health care provider may recommend that you be screened at an earlier age.  If you have a family history of colorectal cancer, talk with your health care provider about genetic screening.  Your health care provider may also recommend using home test kits to check for hidden blood in your stool.  A small camera at the end of a tube can be used to examine your colon directly (sigmoidoscopy or colonoscopy). This is done to check for the earliest forms of colorectal cancer.  Direct examination of the colon should be repeated every  5-10 years until age 71. However, if early forms of precancerous polyps or small growths are found or if you have a family history or genetic risk for colorectal cancer, you may need to be screened more often.  Skin Cancer  Check your skin from head to toe regularly.  Monitor any moles. Be sure to tell your health care provider: ? About any new moles or changes in moles, especially if there is a change in a mole's shape or color. ? If you have a mole that is larger than the size of a pencil eraser.  If any of your family members has a history of skin cancer, especially at a young age, talk with your health care provider about genetic screening.  Always use sunscreen. Apply sunscreen liberally and repeatedly throughout the day.  Whenever you are outside, protect yourself by wearing long sleeves, pants, a wide-brimmed hat, and sunglasses.  What should I know about osteoporosis? Osteoporosis is a condition in which bone destruction happens more quickly than new bone creation. After menopause, you may be at an increased risk for osteoporosis. To help prevent osteoporosis or the bone fractures that can happen because of osteoporosis, the following is recommended:  If you are 46-71 years old, get at least 1,000 mg of calcium and at least 600 mg of vitamin D per day.  If you are older than age 55 but younger than age 65, get at least 1,200 mg of calcium and at least 600 mg of vitamin D per day.  If you are older than age 54, get at least 1,200 mg of calcium and at least 800 mg of vitamin D per day.  Smoking and excessive alcohol intake increase the risk of osteoporosis. Eat foods that are rich in calcium and vitamin D, and do weight-bearing exercises several times each week as directed by your health care provider. What should I know about how menopause affects my mental health? Depression may occur at any age, but it is more common as you become older. Common symptoms of depression  include:  Low or sad mood.  Changes in sleep patterns.  Changes in appetite or eating patterns.  Feeling an overall lack of motivation or enjoyment of activities that you previously enjoyed.  Frequent crying spells.  Talk with your health care provider if you think that you are experiencing depression. What should I know about immunizations? It is important that you get and maintain your immunizations. These include:  Tetanus, diphtheria, and pertussis (Tdap) booster vaccine.  Influenza every year before the flu season begins.  Pneumonia vaccine.  Shingles vaccine.  Your health care provider may also recommend other immunizations. This information is not intended to replace advice given to you by your health care provider. Make sure you discuss any questions you have with your health care provider. Document Released: 12/09/2005  Document Revised: 05/06/2016 Document Reviewed: 07/21/2015 Elsevier Interactive Patient Education  [employer].

## 2017-11-14 NOTE — Progress Notes (Signed)
[id number] 

## 2017-11-14 NOTE — Progress Notes (Signed)
Kylie Kelley 1966-08-04 [id number]    History:    Presents for annual exam.  Postmenopausal on no HRT with no bleeding. Normal Pap and mammogram history. LGSIL many years ago with normal Paps after Had a questionable lesion on her cervix 2014 biopsy benign leukoplakic and normal Paps after. Hypothyroid on Synthroid per primary care. 2016 T score -1.5 FRAX 4.4%/0.4% normal mammogram history. Colonoscopy 2018 Dr. Collene Mares.   Past medical history, past surgical history, family history and social history were all reviewed and documented in the EPIC chart. For children all doing well, twins are 83, one at [facility] thinking about medicine other daughter is at [employer] major.  Son 22 and working at a radio station. One daughter at home, 83 doing well. Parents healthy. 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  ROS:  A ROS was performed and pertinent positives and negatives are included.  Exam:  Vitals:   11/14/17 1408  BP: 110/78  Weight: 118 lb (53.5 kg)  Height: 5\' 4"  (1.626 m)   Body mass index is 20.25 kg/m.   General appearance:  Normal Thyroid:  Symmetrical, normal in size, without palpable masses or nodularity. Respiratory  Auscultation:  Clear without wheezing or rhonchi Cardiovascular  Auscultation:  Regular rate, without rubs, murmurs or gallops  Edema/varicosities:  Not grossly evident Abdominal  Soft,nontender, without masses, guarding or rebound.  Liver/spleen:  No organomegaly noted  Hernia:  None appreciated  Skin  Inspection:  Grossly normal   Breasts: Examined lying and sitting.     Right: Without masses, retractions, discharge or axillary adenopathy.     Left: Without masses, retractions, discharge or axillary adenopathy. Gentitourinary   Inguinal/mons:  Normal without inguinal adenopathy  External genitalia:  Normal  BUS/Urethra/Skene's glands:  Normal  Vagina:  Normal  Cervix:  Normal  Uterus:  normal in size, shape and contour.  Midline and  mobile  Adnexa/parametria:     Rt: Without masses or tenderness.   Lt: Without masses or tenderness.  Anus and perineum: Normal  Digital rectal exam: Normal sphincter tone without palpated masses or tenderness  Assessment/Plan:  52 y.o. MWF G6 P4 for for annual exam with no complaints.  Postmenopausal/no HRT/no bleeding Hypothyroid-primary care manages labs and meds Osteopenia without elevated FRAX  Plan: Repeat DEXA, will schedule. SBE's, continue annual screening mammogram, calcium rich diet, vitamin D 2000 daily encouraged. Home safety, fall prevention and importance of weightbearing exercise reviewed. Pap with HR HPV typing.    [name] [facility], 6:24 PM 11/14/2017

## 2017-11-15 LAB — URINALYSIS W MICROSCOPIC + REFLEX CULTURE
Bacteria, UA: NONE SEEN /HPF
Bilirubin Urine: NEGATIVE
Glucose, UA: NEGATIVE
Hgb urine dipstick: NEGATIVE
Hyaline Cast: NONE SEEN /LPF
Ketones, ur: NEGATIVE
NITRITES URINE, INITIAL: NEGATIVE
Protein, ur: NEGATIVE
RBC / HPF: NONE SEEN /HPF (ref 0–2)
Specific Gravity, Urine: 1.016 (ref 1.001–1.03)
pH: 7.5 (ref 5.0–8.0)

## 2017-11-17 LAB — PAP, TP IMAGING W/ HPV RNA, RFLX HPV TYPE 16,18/45: HPV DNA High Risk: NOT DETECTED

## 2017-11-21 ENCOUNTER — Encounter: Payer: PRIVATE HEALTH INSURANCE | Admitting: Women's Health

## 2018-05-17 ENCOUNTER — Encounter: Payer: Self-pay | Admitting: Women's Health

## 2018-09-19 ENCOUNTER — Encounter: Payer: Self-pay | Admitting: Women's Health

## 2018-09-19 ENCOUNTER — Ambulatory Visit (INDEPENDENT_AMBULATORY_CARE_PROVIDER_SITE_OTHER): Payer: PRIVATE HEALTH INSURANCE | Admitting: Women's Health

## 2018-09-19 VITALS — BP 122/78

## 2018-09-19 DIAGNOSIS — R350 Frequency of micturition: Secondary | ICD-10-CM | POA: Diagnosis not present

## 2018-09-19 DIAGNOSIS — N949 Unspecified condition associated with female genital organs and menstrual cycle: Secondary | ICD-10-CM

## 2018-09-19 DIAGNOSIS — N9489 Other specified conditions associated with female genital organs and menstrual cycle: Secondary | ICD-10-CM

## 2018-09-19 LAB — WET PREP FOR TRICH, YEAST, CLUE

## 2018-09-19 MED ORDER — ESTRADIOL 10 MCG VA TABS: ORAL_TABLET | VAGINAL | 4 refills | Status: DC

## 2018-09-19 NOTE — Patient Instructions (Signed)
Atrophic Vaginitis Atrophic vaginitis is when the tissues that line the vagina become dry and thin. This is caused by a drop in estrogen. Estrogen helps:  To keep the vagina moist.  To make a clear fluid that helps: ? To lubricate the vagina for sex. ? To protect the vagina from infection.  If the lining of the vagina is dry and thin, it may:  Make sex painful. It may also cause bleeding.  Cause a feeling of: ? Burning. ? Irritation. ? Itchiness.  Make an exam of your vagina painful. It may also cause bleeding.  Make you lose interest in sex.  Cause a burning feeling when you pee.  Make your vaginal fluid (discharge) brown or yellow.  For some women, there are no symptoms. This condition is most common in women who do not get their regular menstrual periods anymore (menopause). This often starts when a woman is 73-64 years old. Follow these instructions at home:  Take medicines only as told by your doctor. Do not use any herbal or alternative medicines unless your doctor says it is okay.  Use over-the-counter products for dryness only as told by your doctor. These include: ? Creams. ? Lubricants. ? Moisturizers.  Do not douche.  Do not use products that can make your vagina dry. These include: ? Scented feminine sprays. ? Scented tampons. ? Scented soaps.  If it hurts to have sex, tell your sexual partner. Contact a doctor if:  Your discharge looks different than normal.  Your vagina has an unusual smell.  You have new symptoms.  Your symptoms do not get better with treatment.  Your symptoms get worse. This information is not intended to replace advice given to you by your health care provider. Make sure you discuss any questions you have with your health care provider. Document Released: 04/04/2008 Document Revised: 03/24/2016 Document Reviewed: 10/08/2014 Elsevier Interactive Patient Education  2018 [employer]. Estradiol vaginal tablets What is this  medicine? ESTRADIOL (es tra DYE ole) vaginal tablet is used to help relieve symptoms of vaginal irritation and dryness that occurs in some women during menopause. This medicine may be used for other purposes; ask your health care provider or pharmacist if you have questions. COMMON BRAND NAME(S): Vagifem, Yuvafem What should I tell my health care provider before I take this medicine? They need to know if you have any of these conditions: -abnormal vaginal bleeding -blood vessel disease or blood clots -breast, cervical, endometrial, ovarian, liver, or uterine cancer -dementia -diabetes -gallbladder disease -heart disease or recent heart attack -high blood pressure -high cholesterol -high level of calcium in the blood -hysterectomy -kidney disease -liver disease -migraine headaches -protein C deficiency -protein S deficiency -stroke -systemic lupus erythematosus (SLE) -tobacco smoker -an unusual or allergic reaction to estrogens, other hormones, medicines, foods, dyes, or preservatives -pregnant or trying to get pregnant -breast-feeding How should I use this medicine? This medicine is only for use in the vagina. Do not take by mouth. Wash and dry your hands before and after use. Read package directions carefully. Unwrap the applicator package. Be sure to use a new applicator for each dose. Use at the same time each day. If the tablet has fallen out of the applicator, but is still in the package, carefully place it back into the applicator. If the tablet has fallen out of the package, that applicator should be thrown out and you should use a new applicator containing a new tablet. Lie on your back, part and bend your  knees. Gently insert the applicator as far as comfortably possible into the vagina. Then, gently press the plunger until the plunger is fully depressed. This will release the tablet into the vagina. Gently remove the applicator. Throw away the applicator after use. Do not use  your medicine more often than directed. Do not stop using except on the advice of your doctor or health care professional. Talk to your pediatrician regarding the use of this medicine in children. This medicine is not approved for use in children. A patient package insert for the product will be given with each prescription and refill. Read this sheet carefully each time. The sheet may change frequently. Overdosage: If you think you have taken too much of this medicine contact a poison control center or emergency room at once. NOTE: This medicine is only for you. Do not share this medicine with others. What if I miss a dose? If you miss a dose, take it as soon as you can. If it is almost time for your next dose, take only that dose. Do not take double or extra doses. What may interact with this medicine? Do not take this medicine with any of the following medications: -aromatase inhibitors like aminoglutethimide, anastrozole, exemestane, letrozole, testolactone This medicine may also interact with the following medications: -antibiotics used to treat tuberculosis like rifabutin, rifampin and rifapentene -raloxifene or tamoxifen -warfarin This list may not describe all possible interactions. Give your health care provider a list of all the medicines, herbs, non-prescription drugs, or dietary supplements you use. Also tell them if you smoke, drink alcohol, or use illegal drugs. Some items may interact with your medicine. What should I watch for while using this medicine? Visit your health care professional for regular checks on your progress. You will need a regular breast and pelvic exam. You should also discuss the need for regular mammograms with your health care professional, and follow his or her guidelines. This medicine can make your body retain fluid, making your fingers, hands, or ankles swell. Your blood pressure can go up. Contact your doctor or health care professional if you feel you are  retaining fluid. If you have any reason to think you are pregnant; stop taking this medicine at once and contact your doctor or health care professional. Tobacco smoking increases the risk of getting a blood clot or having a stroke, especially if you are more than 54 years old. You are strongly advised not to smoke. If you wear contact lenses and notice visual changes, or if the lenses begin to feel uncomfortable, consult your eye care specialist. If you are going to have elective surgery, you may need to stop taking this medicine beforehand. Consult your health care professional for advice prior to scheduling the surgery. What side effects may I notice from receiving this medicine? Side effects that you should report to your doctor or health care professional as soon as possible: -allergic reactions like skin rash, itching or hives, swelling of the face, lips, or tongue -breast tissue changes or discharge -changes in vision -chest pain -confusion, trouble speaking or understanding -dark urine -general ill feeling or flu-like symptoms -light-colored stools -nausea, vomiting -pain, swelling, warmth in the leg -right upper belly pain -severe headaches -shortness of breath -sudden numbness or weakness of the face, arm or leg -trouble walking, dizziness, loss of balance or coordination -unusual vaginal bleeding -yellowing of the eyes or skin Side effects that usually do not require medical attention (report to your doctor or health care professional  if they continue or are bothersome): -hair loss -increased hunger or thirst -increased urination -symptoms of vaginal infection like itching, irritation or unusual discharge -unusually weak or tired This list may not describe all possible side effects. Call your doctor for medical advice about side effects. You may report side effects to FDA at 1-800-FDA-1088. Where should I keep my medicine? Keep out of the reach of children. Store at room  temperature between 15 and 30 degrees C (59 and 86 degrees F). Throw away any unused medicine after the expiration date. NOTE: This sheet is a summary. It may not cover all possible information. If you have questions about this medicine, talk to your doctor, pharmacist, or health care provider.  2018 Elsevier/Gold Standard (2014-10-01 09:22:51)

## 2018-09-19 NOTE — Progress Notes (Signed)
52 year old MWF G6, P4 presents with complaint of vaginal irritation, itching, mostly external and pelvic pressure for the past week.  States has less pelvic pressure after urinating.  Has always had urinary frequency, denies pain, burning or pain at end of stream urination.  Denies visible vaginal discharge, back pain, GI changes or fever.  Hypothyroid primary care manages.  Discomfort with intercourse due to dryness some relief with over-the-counter vaginal lubricants.  Exam: Appears well.  Abdomen soft, nontender, external genitalia atrophic, erythematous and introitus, speculum exam, atrophic vaginal walls, no visible discharge, wet prep negative.  Bimanual no CMT or adnexal tenderness, discomfort mostly suprapubic. UA: Negative nitrites, leukocytes, no WBCs, no RBCs, no bacteria  Vaginal atrophy  Plan: Options reviewed, reviewed normality of wet prep and UA.  Would like to try Vagifem reviewed minimal systemic absorption, Vagifem 1 applicator at bedtime for 2 weeks and then twice weekly thereafter.  Prescription, proper use given and reviewed.  Instructed to call if continued problems.  Continue over-the-counter vaginal lubricants.

## 2018-09-20 LAB — URINALYSIS, COMPLETE W/RFL CULTURE
BACTERIA UA: NONE SEEN /HPF
BILIRUBIN URINE: NEGATIVE
Glucose, UA: NEGATIVE
Hgb urine dipstick: NEGATIVE
Hyaline Cast: NONE SEEN /LPF
Ketones, ur: NEGATIVE
LEUKOCYTE ESTERASE: NEGATIVE
Nitrites, Initial: NEGATIVE
Protein, ur: NEGATIVE
RBC / HPF: NONE SEEN /HPF (ref 0–2)
SPECIFIC GRAVITY, URINE: 1.02 (ref 1.001–1.03)
WBC, UA: NONE SEEN /HPF (ref 0–5)
pH: 6.5 (ref 5.0–8.0)

## 2018-09-20 LAB — NO CULTURE INDICATED

## 2018-11-20 ENCOUNTER — Encounter: Payer: PRIVATE HEALTH INSURANCE | Admitting: Women's Health

## 2019-03-11 ENCOUNTER — Other Ambulatory Visit: Payer: Self-pay

## 2019-03-12 ENCOUNTER — Ambulatory Visit (INDEPENDENT_AMBULATORY_CARE_PROVIDER_SITE_OTHER): Payer: PRIVATE HEALTH INSURANCE | Admitting: Women's Health

## 2019-03-12 ENCOUNTER — Encounter: Payer: Self-pay | Admitting: Women's Health

## 2019-03-12 VITALS — BP 110/78 | Ht 64.0 in | Wt 115.0 lb

## 2019-03-12 DIAGNOSIS — Z13820 Encounter for screening for osteoporosis: Secondary | ICD-10-CM

## 2019-03-12 DIAGNOSIS — Z01419 Encounter for gynecological examination (general) (routine) without abnormal findings: Secondary | ICD-10-CM

## 2019-03-12 NOTE — Patient Instructions (Signed)
Vit d 3 2000 iu daily   Health Maintenance for Postmenopausal Women Menopause is a normal process in which your reproductive ability comes to an end. This process happens gradually over a span of months to years, usually between the ages of 50 and 63. Menopause is complete when you have missed 12 consecutive menstrual periods. It is important to talk with your health care provider about some of the most common conditions that affect postmenopausal women, such as heart disease, cancer, and bone loss (osteoporosis). Adopting a healthy lifestyle and getting preventive care can help to promote your health and wellness. Those actions can also lower your chances of developing some of these common conditions. What should I know about menopause? During menopause, you may experience a number of symptoms, such as:  Moderate-to-severe hot flashes.  Night sweats.  Decrease in sex drive.  Mood swings.  Headaches.  Tiredness.  Irritability.  Memory problems.  Insomnia. Choosing to treat or not to treat menopausal changes is an individual decision that you make with your health care provider. What should I know about hormone replacement therapy and supplements? Hormone therapy products are effective for treating symptoms that are associated with menopause, such as hot flashes and night sweats. Hormone replacement carries certain risks, especially as you become older. If you are thinking about using estrogen or estrogen with progestin treatments, discuss the benefits and risks with your health care provider. What should I know about heart disease and stroke? Heart disease, heart attack, and stroke become more likely as you age. This may be due, in part, to the hormonal changes that your body experiences during menopause. These can affect how your body processes dietary fats, triglycerides, and cholesterol. Heart attack and stroke are both medical emergencies. There are many things that you can do to  help prevent heart disease and stroke:  Have your blood pressure checked at least every 1-2 years. High blood pressure causes heart disease and increases the risk of stroke.  If you are 64-64 years old, ask your health care provider if you should take aspirin to prevent a heart attack or a stroke.  Do not use any tobacco products, including cigarettes, chewing tobacco, or electronic cigarettes. If you need help quitting, ask your health care provider.  It is important to eat a healthy diet and maintain a healthy weight. ? Be sure to include plenty of vegetables, fruits, low-fat dairy products, and lean protein. ? Avoid eating foods that are high in solid fats, added sugars, or salt (sodium).  Get regular exercise. This is one of the most important things that you can do for your health. ? Try to exercise for at least 150 minutes each week. The type of exercise that you do should increase your heart rate and make you sweat. This is known as moderate-intensity exercise. ? Try to do strengthening exercises at least twice each week. Do these in addition to the moderate-intensity exercise.  Know your numbers.Ask your health care provider to check your cholesterol and your blood glucose. Continue to have your blood tested as directed by your health care provider.  What should I know about cancer screening? There are several types of cancer. Take the following steps to reduce your risk and to catch any cancer development as early as possible. Breast Cancer  Practice breast self-awareness. ? This means understanding how your breasts normally appear and feel. ? It also means doing regular breast self-exams. Let your health care provider know about any changes, no matter  how small.  If you are 95 or older, have a clinician do a breast exam (clinical breast exam or CBE) every year. Depending on your age, family history, and medical history, it may be recommended that you also have a yearly breast  X-ray (mammogram).  If you have a family history of breast cancer, talk with your health care provider about genetic screening.  If you are at high risk for breast cancer, talk with your health care provider about having an MRI and a mammogram every year.  Breast cancer (BRCA) gene test is recommended for women who have family members with BRCA-related cancers. Results of the assessment will determine the need for genetic counseling and BRCA1 and for BRCA2 testing. BRCA-related cancers include these types: ? Breast. This occurs in males or females. ? Ovarian. ? Tubal. This may also be called fallopian tube cancer. ? Cancer of the abdominal or pelvic lining (peritoneal cancer). ? Prostate. ? Pancreatic. Cervical, Uterine, and Ovarian Cancer Your health care provider may recommend that you be screened regularly for cancer of the pelvic organs. These include your ovaries, uterus, and vagina. This screening involves a pelvic exam, which includes checking for microscopic changes to the surface of your cervix (Pap test).  For women ages 21-65, health care providers may recommend a pelvic exam and a Pap test every three years. For women ages 9-65, they may recommend the Pap test and pelvic exam, combined with testing for human papilloma virus (HPV), every five years. Some types of HPV increase your risk of cervical cancer. Testing for HPV may also be done on women of any age who have unclear Pap test results.  Other health care providers may not recommend any screening for nonpregnant women who are considered low risk for pelvic cancer and have no symptoms. Ask your health care provider if a screening pelvic exam is right for you.  If you have had past treatment for cervical cancer or a condition that could lead to cancer, you need Pap tests and screening for cancer for at least 20 years after your treatment. If Pap tests have been discontinued for you, your risk factors (such as having a new sexual  partner) need to be reassessed to determine if you should start having screenings again. Some women have medical problems that increase the chance of getting cervical cancer. In these cases, your health care provider may recommend that you have screening and Pap tests more often.  If you have a family history of uterine cancer or ovarian cancer, talk with your health care provider about genetic screening.  If you have vaginal bleeding after reaching menopause, tell your health care provider.  There are currently no reliable tests available to screen for ovarian cancer. Lung Cancer Lung cancer screening is recommended for adults 64-3 years old who are at high risk for lung cancer because of a history of smoking. A yearly low-dose CT scan of the lungs is recommended if you:  Currently smoke.  Have a history of at least 30 pack-years of smoking and you currently smoke or have quit within the past 15 years. A pack-year is smoking an average of one pack of cigarettes per day for one year. Yearly screening should:  Continue until it has been 15 years since you quit.  Stop if you develop a health problem that would prevent you from having lung cancer treatment. Colorectal Cancer  This type of cancer can be detected and can often be prevented.  Routine colorectal cancer screening  usually begins at age 31 and continues through age 20.  If you have risk factors for colon cancer, your health care provider may recommend that you be screened at an earlier age.  If you have a family history of colorectal cancer, talk with your health care provider about genetic screening.  Your health care provider may also recommend using home test kits to check for hidden blood in your stool.  A small camera at the end of a tube can be used to examine your colon directly (sigmoidoscopy or colonoscopy). This is done to check for the earliest forms of colorectal cancer.  Direct examination of the colon should be  repeated every 5-10 years until age 68. However, if early forms of precancerous polyps or small growths are found or if you have a family history or genetic risk for colorectal cancer, you may need to be screened more often. Skin Cancer  Check your skin from head to toe regularly.  Monitor any moles. Be sure to tell your health care provider: ? About any new moles or changes in moles, especially if there is a change in a mole's shape or color. ? If you have a mole that is larger than the size of a pencil eraser.  If any of your family members has a history of skin cancer, especially at a young age, talk with your health care provider about genetic screening.  Always use sunscreen. Apply sunscreen liberally and repeatedly throughout the day.  Whenever you are outside, protect yourself by wearing long sleeves, pants, a wide-brimmed hat, and sunglasses. What should I know about osteoporosis? Osteoporosis is a condition in which bone destruction happens more quickly than new bone creation. After menopause, you may be at an increased risk for osteoporosis. To help prevent osteoporosis or the bone fractures that can happen because of osteoporosis, the following is recommended:  If you are 40-17 years old, get at least 1,000 mg of calcium and at least 600 mg of vitamin D per day.  If you are older than age 68 but younger than age 10, get at least 1,200 mg of calcium and at least 600 mg of vitamin D per day.  If you are older than age 26, get at least 1,200 mg of calcium and at least 800 mg of vitamin D per day. Smoking and excessive alcohol intake increase the risk of osteoporosis. Eat foods that are rich in calcium and vitamin D, and do weight-bearing exercises several times each week as directed by your health care provider. What should I know about how menopause affects my mental health? Depression may occur at any age, but it is more common as you become older. Common symptoms of depression  include:  Low or sad mood.  Changes in sleep patterns.  Changes in appetite or eating patterns.  Feeling an overall lack of motivation or enjoyment of activities that you previously enjoyed.  Frequent crying spells. Talk with your health care provider if you think that you are experiencing depression. What should I know about immunizations? It is important that you get and maintain your immunizations. These include:  Tetanus, diphtheria, and pertussis (Tdap) booster vaccine.  Influenza every year before the flu season begins.  Pneumonia vaccine.  Shingles vaccine. Your health care provider may also recommend other immunizations. This information is not intended to replace advice given to you by your health care provider. Make sure you discuss any questions you have with your health care provider. Document Released: 12/09/2005 Document Revised:  05/06/2016 Document Reviewed: 07/21/2015 Elsevier Interactive Patient Education  [employer].

## 2019-03-12 NOTE — Progress Notes (Signed)
Kylie Kelley 12-02-1965 [id number]    History:    Presents for annual exam.  Postmenopausal on no HRT with no bleeding.  LGSIL many years ago normal Paps after.  Normal mammogram history.  Primary care manages hypothyroidism.  2016 T score -1.5 FRAX 4.4% / 0.4%.  2018- colonoscopy Dr. Collene Mares.  Both thyroidism primary care manages.  Had vaginal burning had tried Vagifem but does not want to continue.  Past medical history, past surgical history, family history and social history were all reviewed and documented in the EPIC chart.  Homemaker, part-time business helping people downsize.  Twin girls 21, 1 at [employer], 1 at [employer] in [place].  Son 82 and youngest daughter 73 planning to go to [employer] of [place]..  ROS:  A ROS was performed and pertinent positives and negatives are included.  Exam:  Vitals:   03/12/19 1316  BP: 110/78  Weight: 115 lb (52.2 kg)  Height: 5\' 4"  (1.626 m)   Body mass index is 19.74 kg/m.   General appearance:  Normal Thyroid:  Symmetrical, normal in size, without palpable masses or nodularity. Respiratory  Auscultation:  Clear without wheezing or rhonchi Cardiovascular  Auscultation:  Regular rate, without rubs, murmurs or gallops  Edema/varicosities:  Not grossly evident Abdominal  Soft,nontender, without masses, guarding or rebound.  Liver/spleen:  No organomegaly noted  Hernia:  None appreciated  Skin  Inspection:  Grossly normal   Breasts: Examined lying and sitting.     Right: Without masses, retractions, discharge or axillary adenopathy.     Left: Without masses, retractions, discharge or axillary adenopathy. Gentitourinary   Inguinal/mons:  Normal without inguinal adenopathy  External genitalia:  Normal  BUS/Urethra/Skene's glands:  Normal  Vagina:  Normal  Cervix:  Normal  Uterus:   normal in size, shape and contour.  Midline and mobile  Adnexa/parametria:     Rt: Without masses or tenderness.   Lt: Without masses or  tenderness.  Anus and perineum: Normal  Digital rectal exam: Normal sphincter tone without palpated masses or tenderness  Assessment/Plan:  53 y.o. MWF G6, P4 for annual exam with no complaints.  Postmenopausal/no HRT/no bleeding Hypothyroidism-primary care manages labs and meds Osteopenia without elevated FRAX  Plan: SBEs, continue annual screening mammogram, calcium rich foods, vitamin D 2000 daily encouraged.  Repeat DEXA, continue healthy lifestyle of regular bearing and balance type exercise has a [employer] Gym at home.  Leisure activities and self-care encouraged.  Pap normal 2019, new screening guidelines reviewed.   [name], 4:17 PM 03/12/2019

## 2019-03-12 NOTE — Progress Notes (Signed)
..  lb

## 2019-03-13 LAB — PAP, TP IMAGING W/ HPV RNA, RFLX HPV TYPE 16,18/45: HPV DNA High Risk: NOT DETECTED

## 2019-03-14 LAB — URINALYSIS, COMPLETE W/RFL CULTURE
Bacteria, UA: NONE SEEN /HPF
Bilirubin Urine: NEGATIVE
Glucose, UA: NEGATIVE
Hgb urine dipstick: NEGATIVE
Hyaline Cast: NONE SEEN /LPF
Nitrites, Initial: NEGATIVE
Protein, ur: NEGATIVE
Specific Gravity, Urine: 1.021 (ref 1.001–1.03)
WBC, UA: NONE SEEN /HPF (ref 0–5)
pH: 7.5 (ref 5.0–8.0)

## 2019-03-14 LAB — URINE CULTURE
MICRO NUMBER:: 470354
SPECIMEN QUALITY:: ADEQUATE

## 2019-03-14 LAB — CULTURE INDICATED

## 2019-04-23 ENCOUNTER — Other Ambulatory Visit: Payer: Self-pay

## 2019-04-24 ENCOUNTER — Ambulatory Visit (INDEPENDENT_AMBULATORY_CARE_PROVIDER_SITE_OTHER): Payer: PRIVATE HEALTH INSURANCE

## 2019-04-24 ENCOUNTER — Other Ambulatory Visit: Payer: Self-pay | Admitting: Women's Health

## 2019-04-24 DIAGNOSIS — Z13820 Encounter for screening for osteoporosis: Secondary | ICD-10-CM | POA: Diagnosis not present

## 2019-04-24 DIAGNOSIS — Z780 Asymptomatic menopausal state: Secondary | ICD-10-CM | POA: Diagnosis not present

## 2019-04-24 DIAGNOSIS — M8589 Other specified disorders of bone density and structure, multiple sites: Secondary | ICD-10-CM

## 2019-05-02 ENCOUNTER — Encounter: Payer: Self-pay | Admitting: *Deleted

## 2019-05-22 NOTE — Telephone Encounter (Signed)
My chart message came back unread, patient informed

## 2019-06-03 ENCOUNTER — Encounter: Payer: Self-pay | Admitting: Women's Health

## 2020-03-12 ENCOUNTER — Encounter: Payer: PRIVATE HEALTH INSURANCE | Admitting: Nurse Practitioner

## 2020-03-17 ENCOUNTER — Encounter: Payer: Self-pay | Admitting: Nurse Practitioner

## 2020-03-17 ENCOUNTER — Ambulatory Visit (INDEPENDENT_AMBULATORY_CARE_PROVIDER_SITE_OTHER): Payer: PRIVATE HEALTH INSURANCE | Admitting: Nurse Practitioner

## 2020-03-17 ENCOUNTER — Other Ambulatory Visit: Payer: Self-pay

## 2020-03-17 VITALS — BP 118/76 | Ht 65.0 in | Wt 116.0 lb

## 2020-03-17 DIAGNOSIS — Z1151 Encounter for screening for human papillomavirus (HPV): Secondary | ICD-10-CM

## 2020-03-17 DIAGNOSIS — N870 Mild cervical dysplasia: Secondary | ICD-10-CM

## 2020-03-17 DIAGNOSIS — Z01419 Encounter for gynecological examination (general) (routine) without abnormal findings: Secondary | ICD-10-CM

## 2020-03-17 DIAGNOSIS — M8589 Other specified disorders of bone density and structure, multiple sites: Secondary | ICD-10-CM

## 2020-03-17 DIAGNOSIS — Z780 Asymptomatic menopausal state: Secondary | ICD-10-CM | POA: Diagnosis not present

## 2020-03-17 NOTE — Patient Instructions (Signed)
Health Maintenance, Female Adopting a healthy lifestyle and getting preventive care are important in promoting health and wellness. Ask your health care provider about:  The right schedule for you to have regular tests and exams.  Things you can do on your own to prevent diseases and keep yourself healthy. What should I know about diet, weight, and exercise? Eat a healthy diet   Eat a diet that includes plenty of vegetables, fruits, low-fat dairy products, and lean protein.  Do not eat a lot of foods that are high in solid fats, added sugars, or sodium. Maintain a healthy weight Body mass index (BMI) is used to identify weight problems. It estimates body fat based on height and weight. Your health care provider can help determine your BMI and help you achieve or maintain a healthy weight. Get regular exercise Get regular exercise. This is one of the most important things you can do for your health. Most adults should:  Exercise for at least 150 minutes each week. The exercise should increase your heart rate and make you sweat (moderate-intensity exercise).  Do strengthening exercises at least twice a week. This is in addition to the moderate-intensity exercise.  Spend less time sitting. Even light physical activity can be beneficial. Watch cholesterol and blood lipids Have your blood tested for lipids and cholesterol at 56 years of age, then have this test every 5 years. Have your cholesterol levels checked more often if:  Your lipid or cholesterol levels are high.  You are older than 56 years of age.  You are at high risk for heart disease. What should I know about cancer screening? Depending on your health history and family history, you may need to have cancer screening at various ages. This may include screening for:  Breast cancer.  Cervical cancer.  Colorectal cancer.  Skin cancer.  Lung cancer. What should I know about heart disease, diabetes, and high blood  pressure? Blood pressure and heart disease  High blood pressure causes heart disease and increases the risk of stroke. This is more likely to develop in people who have high blood pressure readings, are of African descent, or are overweight.  Have your blood pressure checked: ? Every 3-5 years if you are 18-39 years of age. ? Every year if you are 40 years old or older. Diabetes Have regular diabetes screenings. This checks your fasting blood sugar level. Have the screening done:  Once every three years after age 40 if you are at a normal weight and have a low risk for diabetes.  More often and at a younger age if you are overweight or have a high risk for diabetes. What should I know about preventing infection? Hepatitis B If you have a higher risk for hepatitis B, you should be screened for this virus. Talk with your health care provider to find out if you are at risk for hepatitis B infection. Hepatitis C Testing is recommended for:  Everyone born from 1945 through 1965.  Anyone with known risk factors for hepatitis C. Sexually transmitted infections (STIs)  Get screened for STIs, including gonorrhea and chlamydia, if: ? You are sexually active and are younger than 56 years of age. ? You are older than 56 years of age and your health care provider tells you that you are at risk for this type of infection. ? Your sexual activity has changed since you were last screened, and you are at increased risk for chlamydia or gonorrhea. Ask your health care provider if   you are at risk.  Ask your health care provider about whether you are at high risk for HIV. Your health care provider may recommend a prescription medicine to help prevent HIV infection. If you choose to take medicine to prevent HIV, you should first get tested for HIV. You should then be tested every 3 months for as long as you are taking the medicine. Pregnancy  If you are about to stop having your period (premenopausal) and  you may become pregnant, seek counseling before you get pregnant.  Take 400 to 800 micrograms (mcg) of folic acid every day if you become pregnant.  Ask for birth control (contraception) if you want to prevent pregnancy. Osteoporosis and menopause Osteoporosis is a disease in which the bones lose minerals and strength with aging. This can result in bone fractures. If you are 65 years old or older, or if you are at risk for osteoporosis and fractures, ask your health care provider if you should:  Be screened for bone loss.  Take a calcium or vitamin D supplement to lower your risk of fractures.  Be given hormone replacement therapy (HRT) to treat symptoms of menopause. Follow these instructions at home: Lifestyle  Do not use any products that contain nicotine or tobacco, such as cigarettes, e-cigarettes, and chewing tobacco. If you need help quitting, ask your health care provider.  Do not use street drugs.  Do not share needles.  Ask your health care provider for help if you need support or information about quitting drugs. Alcohol use  Do not drink alcohol if: ? Your health care provider tells you not to drink. ? You are pregnant, may be pregnant, or are planning to become pregnant.  If you drink alcohol: ? Limit how much you use to 0-1 drink a day. ? Limit intake if you are breastfeeding.  Be aware of how much alcohol is in your drink. In the [place] one drink equals one 12 oz bottle of beer (355 mL), one 5 oz glass of wine (148 mL), or one 1 oz glass of hard liquor (44 mL). General instructions  Schedule regular health, dental, and eye exams.  Stay current with your vaccines.  Tell your health care provider if: ? You often feel depressed. ? You have ever been abused or do not feel safe at home. Summary  Adopting a healthy lifestyle and getting preventive care are important in promoting health and wellness.  Follow your health care provider's instructions about healthy  diet, exercising, and getting tested or screened for diseases.  Follow your health care provider's instructions on monitoring your cholesterol and blood pressure. This information is not intended to replace advice given to you by your health care provider. Make sure you discuss any questions you have with your health care provider. Document Revised: 10/10/2018 Document Reviewed: 10/10/2018 Elsevier Patient Education  2020 Elsevier Inc.  

## 2020-03-17 NOTE — Progress Notes (Signed)
   Kylie Kelley Sep 14, 1966 [id number]   History:  54 y.o. G6 P4 presents for annual exam without GYN complaints.  Postmenopausal, no HRT and no bleeding.  History of LGSIL, subsequent Paps normal.  Normal mammogram history.  Hypothyroidism managed by primary care. Saw PCP recently and had labs done, normal per patient.   Gynecologic History Patient's last menstrual period was 08/16/2013.   Contraception: post menopausal status Last Pap: 03/12/2019. Results were: normal Last mammogram: 06/03/2019. Results were: normal Colonoscopy: 2016 Dexa: 04/24/2019. Results: Osteopenia, multiple sites. T-score -2.4 FRAX 7.5% / 1.5%  Past medical history, past surgical history, family history and social history were all reviewed and documented in the EPIC chart. Homemaker, has business helping people downsize. 4 children-Twin girls ages 33, Son 53 and youngest daughter 88 attending college in [place] for sports management.   ROS:  A ROS was performed and pertinent positives and negatives are included.  Exam:  Vitals:   03/17/20 1112  BP: 118/76  Weight: 116 lb (52.6 kg)  Height: 5\' 5"  (1.651 m)   Body mass index is 19.3 kg/m.  General appearance:  Normal Thyroid:  Symmetrical, normal in size, without palpable masses or nodularity. Respiratory  Auscultation:  Clear without wheezing or rhonchi Cardiovascular  Auscultation:  Regular rate, without rubs, murmurs or gallops  Edema/varicosities:  Not grossly evident Abdominal  Soft,nontender, without masses, guarding or rebound.  Liver/spleen:  No organomegaly noted  Hernia:  None appreciated  Skin  Inspection:  Grossly normal   Breasts: Examined lying and sitting.   Right: Without masses, retractions, discharge or axillary adenopathy.   Left: Without masses, retractions, discharge or axillary adenopathy. Gentitourinary   Inguinal/mons:  Normal without inguinal adenopathy  External genitalia:  Normal  BUS/Urethra/Skene's glands:   Normal  Vagina:  Atrophic changes  Cervix:  Normal  Uterus:  Normal in size, shape and contour.  Midline and mobile  Adnexa/parametria:     Rt: Without masses or tenderness.   Lt: Without masses or tenderness.  Anus and perineum: Normal  Digital rectal exam: Normal sphincter tone without palpated masses or tenderness  Assessment/Plan:  54 y.o.  for annual exam.   Well female exam with routine gynecological exam - Plan: PAP,TP IMGw/HPV RNA,rflx [id number]. Education provided on SBEs, importance of preventative screenings, current guidelines, high calcium diet, regular exercise, and multivitamin daily.   Postmenopausal - no HRT  Osteopenia of multiple sites - Plan: DG Bone Density. Fell in February and bruised ribs. Has not been taking Vitamin D supplement. Encouraged to continue exercise to include balance and weight-bearing exercises. Being taking 2000 units Vitamin Daily with multivitamin.   CIN I (cervical intraepithelial neoplasia I) - Plan: PAP,TP IMGw/HPV RNA,rflx [id number]   Follow up in 1 year for annual      [place], 12:14 PM 03/17/2020

## 2020-03-18 LAB — PAP, TP IMAGING W/ HPV RNA, RFLX HPV TYPE 16,18/45: HPV DNA High Risk: NOT DETECTED

## 2020-06-10 ENCOUNTER — Encounter: Payer: Self-pay | Admitting: Nurse Practitioner

## 2020-10-02 ENCOUNTER — Emergency Department (HOSPITAL_COMMUNITY)
Admission: EM | Admit: 2020-10-02 | Discharge: 2020-10-02 | Disposition: A | Discharge status: Home / Self Care | Payer: PRIVATE HEALTH INSURANCE | Attending: Emergency Medicine | Admitting: Emergency Medicine

## 2020-10-02 ENCOUNTER — Encounter (HOSPITAL_COMMUNITY): Payer: Self-pay

## 2020-10-02 ENCOUNTER — Other Ambulatory Visit: Payer: Self-pay

## 2020-10-02 ENCOUNTER — Emergency Department (HOSPITAL_COMMUNITY): Payer: PRIVATE HEALTH INSURANCE

## 2020-10-02 DIAGNOSIS — W11XXXA Fall on and from ladder, initial encounter: Secondary | ICD-10-CM | POA: Insufficient documentation

## 2020-10-02 DIAGNOSIS — M7989 Other specified soft tissue disorders: Secondary | ICD-10-CM | POA: Insufficient documentation

## 2020-10-02 DIAGNOSIS — Z87891 Personal history of nicotine dependence: Secondary | ICD-10-CM | POA: Diagnosis not present

## 2020-10-02 DIAGNOSIS — M25561 Pain in right knee: Secondary | ICD-10-CM | POA: Diagnosis not present

## 2020-10-02 DIAGNOSIS — S8991XA Unspecified injury of right lower leg, initial encounter: Secondary | ICD-10-CM | POA: Diagnosis not present

## 2020-10-02 DIAGNOSIS — E039 Hypothyroidism, unspecified: Secondary | ICD-10-CM | POA: Diagnosis not present

## 2020-10-02 DIAGNOSIS — R52 Pain, unspecified: Secondary | ICD-10-CM

## 2020-10-02 MED ORDER — ACETAMINOPHEN 500 MG PO TABS
1000.0000 mg | ORAL_TABLET | Freq: Once | ORAL | Status: AC
  Administered 2020-10-02: 1000 mg via ORAL
  Filled 2020-10-02: qty 2

## 2020-10-02 NOTE — ED Notes (Signed)
Ortho at bedside.

## 2020-10-02 NOTE — Progress Notes (Signed)
Orthopedic Tech Progress Note Patient Details:  Kylie Kelley 1966-03-28 [id number]  Ortho Devices Ortho Device/Splint Location: applied knee immobilizer to RLE and crutches Ortho Device/Splint Interventions: Ordered, Application, Adjustment   Post Interventions Patient Tolerated: Well Instructions Provided: Care of device   Braulio Bosch 10/02/2020, 6:03 PM

## 2020-10-02 NOTE — Discharge Instructions (Signed)
Came to the emergency department today to have your right knee pain evaluated.  Today your x-rays did not show any obvious broken bones. As we discussed earlier x-rays show bone, they do not show any ligaments which connect bone to bone and tendons that connect muscle to bone which you may have injured.  Please follow up with the bone doctor (orthopedist) if your symptoms are not better in 1-2 weeks.   Please rest, ice and elevate your knee to help alleviate swelling.  Please use the crutches to help take weightbearing pressure off of your right leg.  Please take Ibuprofen (Advil, motrin) and Tylenol (acetaminophen) to relieve your pain.  You may take up to 600 MG (3 pills) of normal strength ibuprofen every 8 hours as needed.  In between doses of ibuprofen you make take tylenol, up to 1,000 mg (two extra strength pills).  Do not take more than 3,000 mg tylenol in a 24 hour period.  Please check all medication labels as many medications such as pain and cold medications may contain tylenol.  Do not drink alcohol while taking these medications.  Do not take other NSAID'S while taking ibuprofen (such as aleve or naproxen).  Please take ibuprofen with food to decrease stomach upset.   Please emergency the emergency department if Your knee swells, and the swelling becomes worse. You cannot move your knee. You have severe pain in your knee. A feeling of tightness or fullness in the affected area. A loss of feeling. Weakness in the area. Loss of movement. Skin becoming pale, tight, and shiny over the painful area. Warmth and tenderness.

## 2020-10-02 NOTE — ED Provider Notes (Signed)
[facility] DEPT Provider Note   CSN: [id number] Arrival date & time: 10/02/20  1530     History Chief Complaint  Patient presents with  . Knee Injury    Kylie Kelley is a 54 y.o. female with history of hypothyroidism.  Patient is brought to the emergency department via EMS after suffering a fall from a ladder approximately 4 to 6 feet.  Patient reports that she was wearing platform shoes, lost her balance and fell.  Patient denies any LOC, neck pain, back pain, lightheadedness, dizziness, nausea, vomiting, numbness, tingling, weakness, headache or seizure.  She complains of pain to her right knee, the pain began directly after her fall, has been constant, 5/10 on pain scale, worse with bending of her knee.  Patient states that she was unable to put pressure on her leg after her fall.  Right leg was immobilized via long-leg splint by EMS.  Patient denies any blood thinner use.    HPI     Past Medical History:  Diagnosis Date  . Hypothyroid   . Osteopenia 02/2012   T score 1.1 FRAX 2.8%/0.2    Patient Active Problem List   Diagnosis Date Noted  . CIN I (cervical intraepithelial neoplasia I)   . Hypothyroid     Past Surgical History:  Procedure Laterality Date  . CESAREAN SECTION     X2  . COLPOSCOPY    . GYNECOLOGIC CRYOSURGERY    . MANDIBLE SURGERY    . NOSE SURGERY       OB History    Gravida  6   Para  4   Term      Preterm      AB  2   Living  4     SAB  2   TAB      Ectopic      Multiple      Live Births              Family History  Problem Relation Age of Onset  . Breast cancer Paternal Grandmother        Age 41's    Social History   Tobacco Use  . Smoking status: Former Research scientist (life sciences)  . Smokeless tobacco: Never Used  Vaping Use  . Vaping Use: Never used  Substance Use Topics  . Alcohol use: Yes    Alcohol/week: 2.0 standard drinks    Types: 2 Standard drinks or equivalent per week    Comment:  occas  . Drug use: No    Home Medications Prior to Admission medications   Medication Sig Start Date End Date Taking? Authorizing Provider  ALPRAZolam (XANAX) 0.25 MG tablet Take 0.25 mg by mouth daily as needed. 10/25/14   [provider]  Ascorbic Acid (VITAMIN C PO) Take 1 tablet by mouth daily.     [provider]  Glucos-Chond-Hyal Ac-Ca Fructo (MOVE FREE JOINT HEALTH ADVANCE PO) Take 1 tablet by mouth daily.    [provider]  levothyroxine (SYNTHROID, LEVOTHROID) 112 MCG tablet Take 1 tablet (112 mcg total) by mouth daily. 12/13/16   [name], NP  NIACIN PO Take 1 tablet by mouth daily.    [provider]  NONFORMULARY OR COMPOUNDED ITEM Mega red  1 tab po qd    [provider]  Prenatal Vit-Fe Sulfate-FA (PRENATAL VITAMIN PO) Take 1 tablet by mouth daily.     [provider]    Allergies    Latex  Review  of Systems   Review of Systems  Constitutional: Negative for chills and fever.  HENT: Negative for facial swelling and nosebleeds.   Eyes: Negative for visual disturbance.  Respiratory: Negative for shortness of breath.   Cardiovascular: Negative for chest pain.  Gastrointestinal: Negative for abdominal pain, nausea and vomiting.  Genitourinary: Negative for difficulty urinating and dysuria.  Musculoskeletal: Negative for back pain and neck pain.       Right knee pain  Skin: Negative for color change and rash.  Neurological: Negative for dizziness, tremors, seizures, syncope, speech difficulty, weakness, light-headedness, numbness and headaches.  Psychiatric/Behavioral: Negative for confusion.    Physical Exam Updated Vital Signs BP 118/80   Pulse 80   Temp 98.2 F (36.8 C) (Oral)   Resp 18   LMP 08/16/2013   SpO2 98%   Physical Exam Constitutional:      General: She is not in acute distress.    Appearance: She is normal weight. She is not ill-appearing, toxic-appearing or diaphoretic.  HENT:      Head: Normocephalic. No raccoon eyes, Battle's sign, abrasion, contusion, masses or laceration.     Jaw: There is normal jaw occlusion.  Eyes:     Extraocular Movements: Extraocular movements intact.     Pupils: Pupils are equal, round, and reactive to light.  Cardiovascular:     Rate and Rhythm: Normal rate.  Pulmonary:     Effort: Pulmonary effort is normal.  Abdominal:     Palpations: Abdomen is soft.     Tenderness: There is no abdominal tenderness.  Musculoskeletal:     Right shoulder: No swelling, deformity, effusion, laceration, tenderness, bony tenderness or crepitus. Normal range of motion. Normal strength.     Left shoulder: No swelling, deformity, effusion, laceration, tenderness, bony tenderness or crepitus. Normal range of motion. Normal strength.     Right upper arm: No swelling, deformity, tenderness or bony tenderness.     Left upper arm: No swelling, deformity, tenderness or bony tenderness.     Right elbow: No swelling. Normal range of motion. No tenderness.     Left elbow: No swelling. Normal range of motion. No tenderness.     Right forearm: No swelling, deformity, tenderness or bony tenderness.     Left forearm: No swelling, deformity, tenderness or bony tenderness.     Right wrist: No swelling, deformity, tenderness or bony tenderness.     Left wrist: No swelling, deformity, tenderness or bony tenderness.     Cervical back: Neck supple. No tenderness or bony tenderness. No spinous process tenderness or muscular tenderness.     Thoracic back: No tenderness or bony tenderness.     Lumbar back: No tenderness or bony tenderness.     Right hip: No deformity, tenderness or bony tenderness.     Left hip: No deformity, tenderness or bony tenderness. Normal range of motion.     Right upper leg: No swelling, deformity, lacerations, tenderness or bony tenderness.     Left upper leg: No swelling, deformity, lacerations, tenderness or bony tenderness.     Right knee: Swelling  and bony tenderness present. No crepitus. Decreased range of motion: patient would not allow her knee to be bent  Tenderness present over the medial joint line and lateral joint line.     Left knee: No swelling, bony tenderness or crepitus. Normal range of motion. No tenderness.     Right lower leg: No swelling, deformity, tenderness or bony tenderness.     Left lower leg: No  swelling, deformity, tenderness or bony tenderness.     Right foot: Normal range of motion. No swelling, tenderness or bony tenderness. Normal pulse.     Left foot: Normal range of motion. No swelling, tenderness or bony tenderness. Normal pulse.     Comments: No erythema, contusion, or hematoma noted to patient's right knee or leg.  Skin:    General: Skin is warm and dry.  Neurological:     General: No focal deficit present.     Mental Status: She is alert.     ED Results / Procedures / Treatments   Labs (all labs ordered are listed, but only abnormal results are displayed) Labs Reviewed - No data to display  EKG None  Radiology DG Knee Complete 4 Views Right  Result Date: 10/02/2020 CLINICAL DATA:  Fall, right knee pain EXAM: RIGHT KNEE - COMPLETE 4+ VIEW COMPARISON:  None. FINDINGS: Frontal, bilateral oblique, and lateral views of the right knee are obtained. No fracture, subluxation, or dislocation. Joint spaces are well preserved. Evaluation for effusion is somewhat limited due to oblique positioning on the lateral view. No obvious effusion. IMPRESSION: 1. No acute bony abnormality. Electronically Signed   By: Randa Ngo M.D.   On: 10/02/2020 17:47    Procedures Procedures (including critical care time)  Medications Ordered in ED Medications  acetaminophen (TYLENOL) tablet 1,000 mg (1,000 mg Oral Given 10/02/20 1654)    ED Course  I have reviewed the triage vital signs and the nursing notes.  Pertinent labs & imaging results that were available during my care of the patient were reviewed by me and  considered in my medical decision making (see chart for details).    MDM Rules/Calculators/A&P                          Alert 54 year old female in no acute distress presents to ED via EMS after suffering a fall ladder approximately 4 to 6 feet.  Patient denies any LOC, neck pain, back pain, lightheadedness, dizziness, nausea, vomiting, numbness, tingling, weakness, headache or seizure.  Patient complains of pain to right knee, 5/10 and is unwilling to bend it.  Patient is in a left long-leg splint placed by EMS, PMS is intact in right foot.  Patient reports that when she tried to stand after suffering a fall she was unable to put pressure on her right leg.  Patient was not immobilized the c-collar.  Patient's neck is supple she is able to move it without pain, no spinous process tenderness to c-spine, thoracic or lumbar spine.  Patient has no focal neuro deficits, head is atraumatic, pupils are PERRL.  Patient has full passive range of motion of left knee and hip, bilateral shoulders and elbows.  Observed to have swelling to medial and lateral right knee.  She noticed bony tenderness and tenderness to medial and lateral joint line.    X-ray of right knee and Right hip/pelvis, pending.  Patient given Tylenol for pain treatment.  Was contacted by radiology tech M.Santos reported that patient refused x-ray of right hip and pelvis.  X-ray of right knee showed no acute bony abnormality.  Patient remained stable and her pain is controlled with Tylenol.   Patient was ordered knee immobilizer for right knee, and crutches. Patient will follow up with outpatient orthopedics and was given information for Dr. Griffin Basil.  Patient given information to rest, ice, and elevate her right leg when resting.  Patient told to use Tylenol  and NSAIDs as needed for pain management.  Patient will be told to use knee immobilizer when not sitting or resting, and crutches to remain nonweightbearing on her right leg.  Patient was given  strict return  Precautions.  Patient expressed understanding of all instructions, and all her questions were answered.    Final Clinical Impression(s) / ED Diagnoses Final diagnoses:  Acute pain of right knee    Rx / DC Orders ED Discharge Orders    None       Dyann Ruddle 10/02/20 1846    Quintella Reichert, MD 10/03/20 [phone]

## 2020-10-02 NOTE — ED Notes (Signed)
DC paperwork reviewed with pt at this time. Pt awaiting ride at this time. Pts husband arrive in 20 mins

## 2020-10-02 NOTE — ED Triage Notes (Signed)
Pt arrives [facility] from home with complaints of a fall from a ladder 4-5 feet. No LOC, no head trauma. No blood thinners. Pt reports right knee pain. Pt denies back or neck pain. Rt knee swelling, no obvious deformity.

## 2021-02-23 IMAGING — CR DG KNEE COMPLETE 4+V*R*
4 series · 4 of 4 positions shown · non-contrast
Comparison: None.

CLINICAL DATA: Fall, right knee pain

EXAM:
RIGHT KNEE - COMPLETE 4+ VIEW

[x knee ap right]
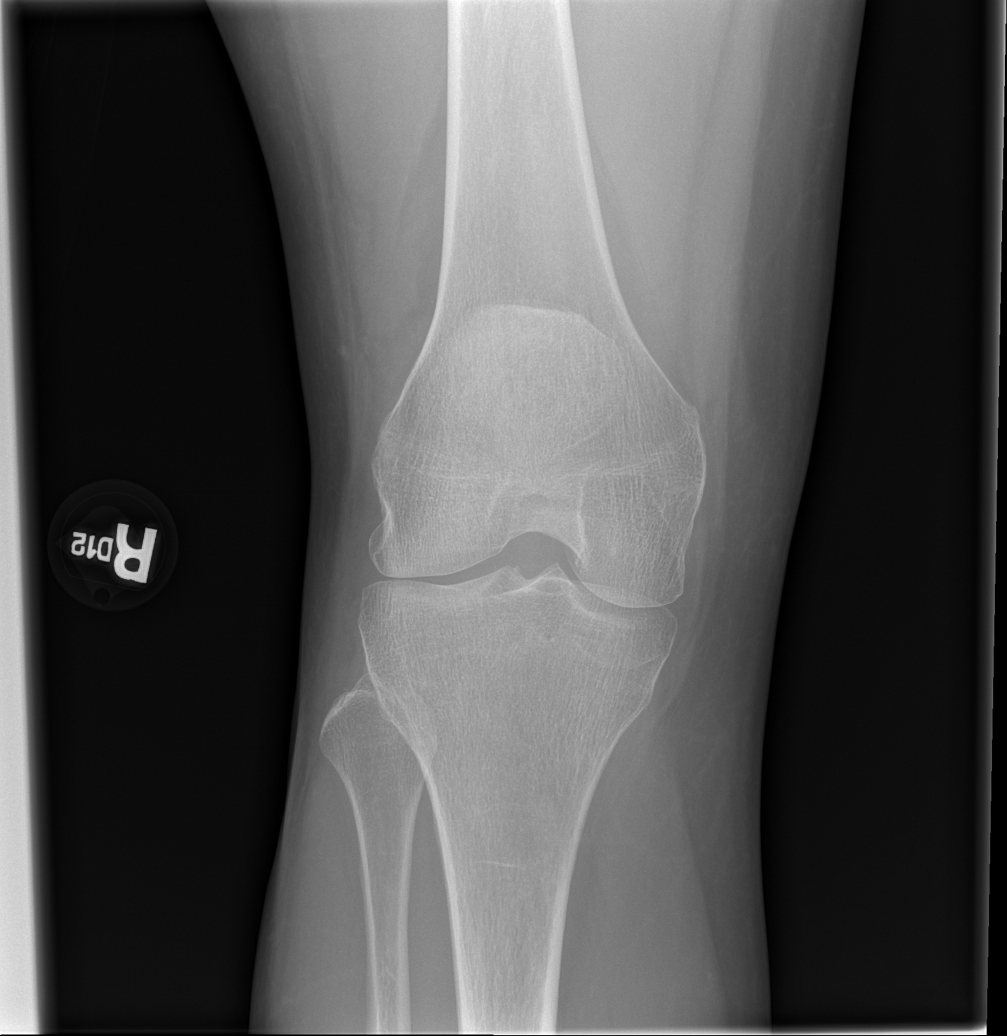

[x knee obl right (1 of 2)]
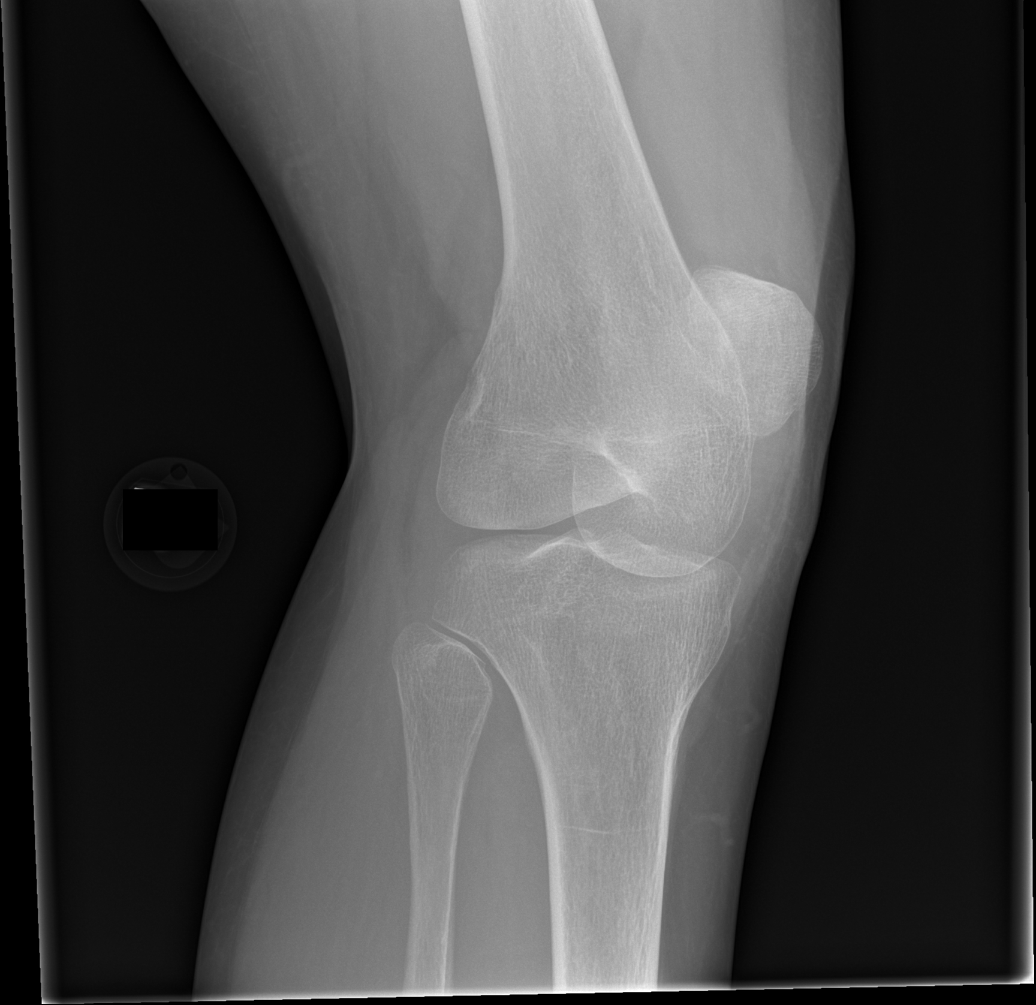

[x knee obl right (2 of 2)]
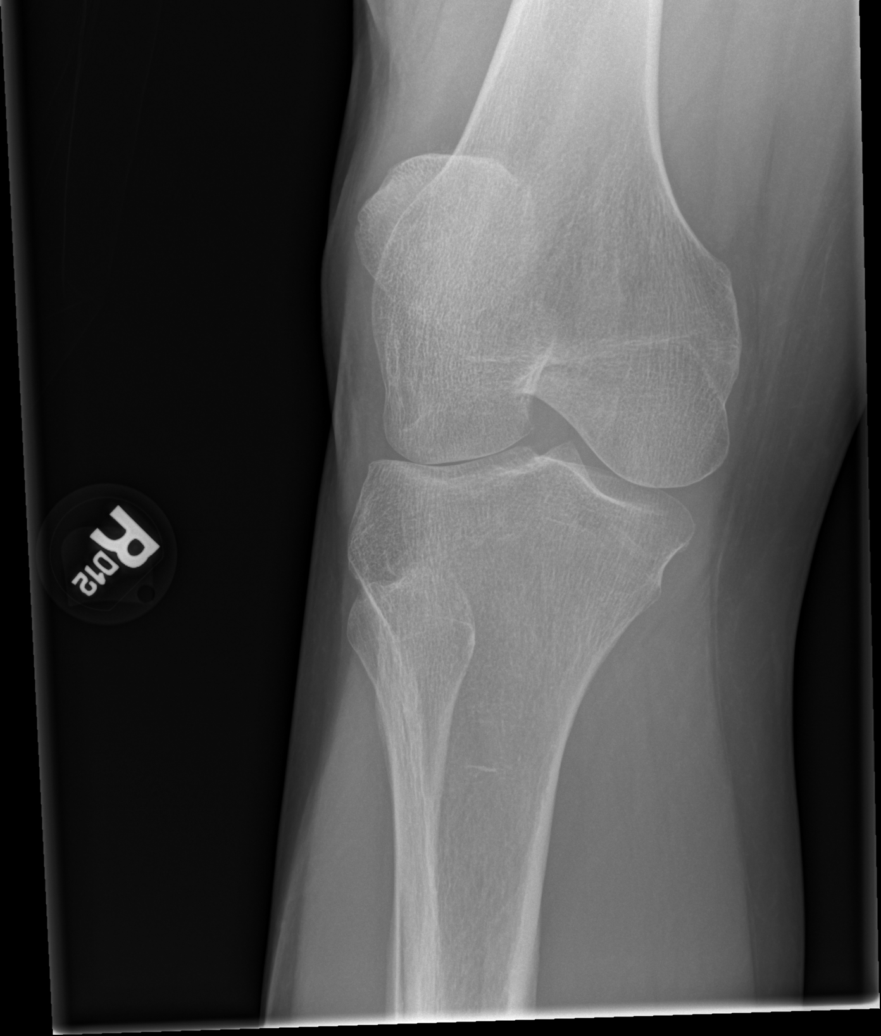

[x knee lat right]
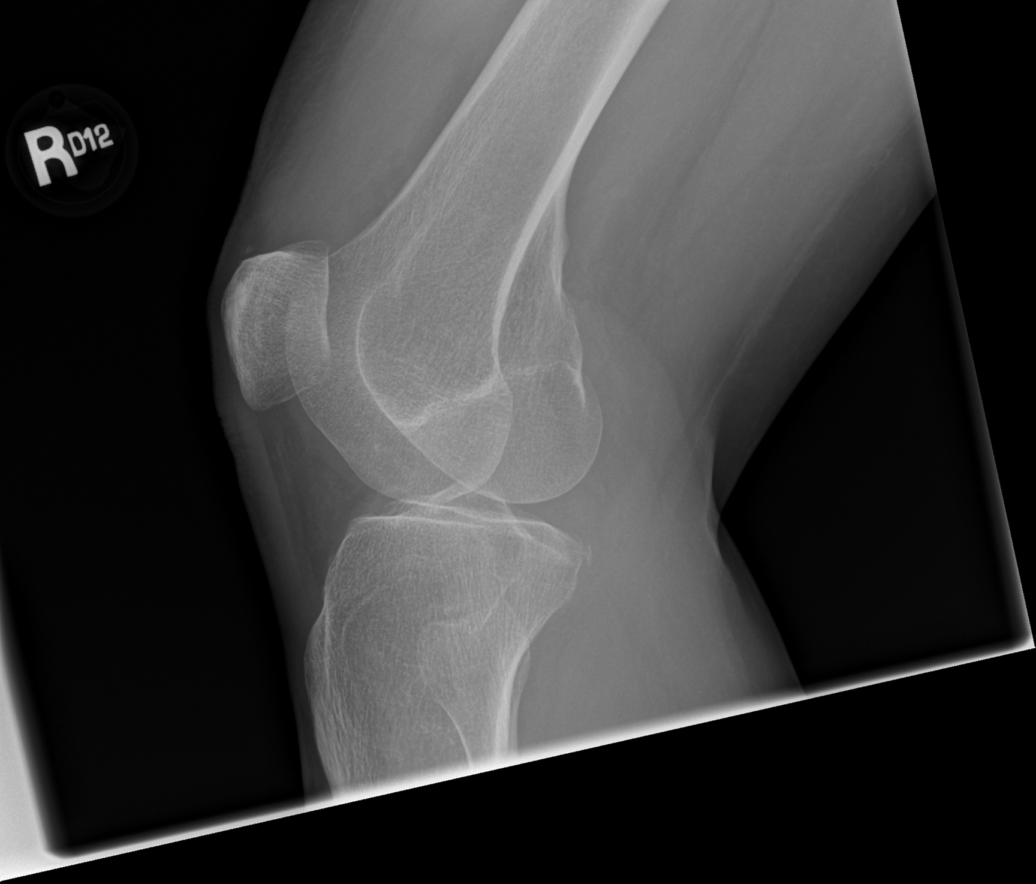

[4 of 4 positions shown; findings below may reference images not displayed]

FINDINGS: Frontal, bilateral oblique, and lateral views of the right knee are
obtained. No fracture, subluxation, or dislocation. Joint spaces are
well preserved. Evaluation for effusion is somewhat limited due to
oblique positioning on the lateral view. No obvious effusion.
IMPRESSION: 1. No acute bony abnormality.

## 2021-03-24 ENCOUNTER — Other Ambulatory Visit: Payer: Self-pay

## 2021-03-24 ENCOUNTER — Other Ambulatory Visit (HOSPITAL_COMMUNITY)
Admission: RE | Admit: 2021-03-24 | Discharge: 2021-03-24 | Disposition: A | Discharge status: Home / Self Care | Payer: PRIVATE HEALTH INSURANCE | Source: Ambulatory Visit | Attending: Nurse Practitioner | Admitting: Nurse Practitioner

## 2021-03-24 ENCOUNTER — Ambulatory Visit (INDEPENDENT_AMBULATORY_CARE_PROVIDER_SITE_OTHER): Payer: PRIVATE HEALTH INSURANCE | Admitting: Nurse Practitioner

## 2021-03-24 ENCOUNTER — Encounter: Payer: Self-pay | Admitting: Nurse Practitioner

## 2021-03-24 VITALS — BP 112/76 | Ht 65.0 in | Wt 116.0 lb

## 2021-03-24 DIAGNOSIS — M8589 Other specified disorders of bone density and structure, multiple sites: Secondary | ICD-10-CM

## 2021-03-24 DIAGNOSIS — Z01419 Encounter for gynecological examination (general) (routine) without abnormal findings: Secondary | ICD-10-CM | POA: Insufficient documentation

## 2021-03-24 DIAGNOSIS — Z780 Asymptomatic menopausal state: Secondary | ICD-10-CM

## 2021-03-24 DIAGNOSIS — L900 Lichen sclerosus et atrophicus: Secondary | ICD-10-CM

## 2021-03-24 MED ORDER — CLOBETASOL PROPIONATE 0.05 % EX OINT: TOPICAL_OINTMENT | CUTANEOUS | 2 refills | Status: DC

## 2021-03-24 NOTE — Patient Instructions (Addendum)
Health Maintenance for Postmenopausal Women Menopause is a normal process in which your ability to get pregnant comes to an end. This process happens slowly over many months or years, usually between the ages of 48 and 55. Menopause is complete when you have missed your menstrual periods for 12 months. It is important to talk with your health care provider about some of the most common conditions that affect women after menopause (postmenopausal women). These include heart disease, cancer, and bone loss (osteoporosis). Adopting a healthy lifestyle and getting preventive care can help to promote your health and wellness. The actions you take can also lower your chances of developing some of these common conditions. What should I know about menopause? During menopause, you may get a number of symptoms, such as:  Hot flashes. These can be moderate or severe.  Night sweats.  Decrease in sex drive.  Mood swings.  Headaches.  Tiredness.  Irritability.  Memory problems.  Insomnia. Choosing to treat or not to treat these symptoms is a decision that you make with your health care provider. Do I need hormone replacement therapy?  Hormone replacement therapy is effective in treating symptoms that are caused by menopause, such as hot flashes and night sweats.  Hormone replacement carries certain risks, especially as you become older. If you are thinking about using estrogen or estrogen with progestin, discuss the benefits and risks with your health care provider. What is my risk for heart disease and stroke? The risk of heart disease, heart attack, and stroke increases as you age. One of the causes may be a change in the body's hormones during menopause. This can affect how your body uses dietary fats, triglycerides, and cholesterol. Heart attack and stroke are medical emergencies. There are many things that you can do to help prevent heart disease and stroke. Watch your blood pressure  High  blood pressure causes heart disease and increases the risk of stroke. This is more likely to develop in people who have high blood pressure readings, are of African descent, or are overweight.  Have your blood pressure checked: ? Every 3-5 years if you are 18-39 years of age. ? Every year if you are 40 years old or older. Eat a healthy diet  Eat a diet that includes plenty of vegetables, fruits, low-fat dairy products, and lean protein.  Do not eat a lot of foods that are high in solid fats, added sugars, or sodium.   Get regular exercise Get regular exercise. This is one of the most important things you can do for your health. Most adults should:  Try to exercise for at least 150 minutes each week. The exercise should increase your heart rate and make you sweat (moderate-intensity exercise).  Try to do strengthening exercises at least twice each week. Do these in addition to the moderate-intensity exercise.  Spend less time sitting. Even light physical activity can be beneficial. Other tips  Work with your health care provider to achieve or maintain a healthy weight.  Do not use any products that contain nicotine or tobacco, such as cigarettes, e-cigarettes, and chewing tobacco. If you need help quitting, ask your health care provider.  Know your numbers. Ask your health care provider to check your cholesterol and your blood sugar (glucose). Continue to have your blood tested as directed by your health care provider. Do I need screening for cancer? Depending on your health history and family history, you may need to have cancer screening at different stages of your life.   This may include screening for:  Breast cancer.  Cervical cancer.  Lung cancer.  Colorectal cancer. What is my risk for osteoporosis? After menopause, you may be at increased risk for osteoporosis. Osteoporosis is a condition in which bone destruction happens more quickly than new bone creation. To help prevent  osteoporosis or the bone fractures that can happen because of osteoporosis, you may take the following actions:  If you are 82-24 years old, get at least 1,000 mg of calcium and at least 600 mg of vitamin D per day.  If you are older than age 59 but younger than age 55, get at least 1,200 mg of calcium and at least 600 mg of vitamin D per day.  If you are older than age 10, get at least 1,200 mg of calcium and at least 800 mg of vitamin D per day. Smoking and drinking excessive alcohol increase the risk of osteoporosis. Eat foods that are rich in calcium and vitamin D, and do weight-bearing exercises several times each week as directed by your health care provider. How does menopause affect my mental health? Depression may occur at any age, but it is more common as you become older. Common symptoms of depression include:  Low or sad mood.  Changes in sleep patterns.  Changes in appetite or eating patterns.  Feeling an overall lack of motivation or enjoyment of activities that you previously enjoyed.  Frequent crying spells. Talk with your health care provider if you think that you are experiencing depression. General instructions See your health care provider for regular wellness exams and vaccines. This may include:  Scheduling regular health, dental, and eye exams.  Getting and maintaining your vaccines. These include: ? Influenza vaccine. Get this vaccine each year before the flu season begins. ? Pneumonia vaccine. ? Shingles vaccine. ? Tetanus, diphtheria, and pertussis (Tdap) booster vaccine. Your health care provider may also recommend other immunizations. Tell your health care provider if you have ever been abused or do not feel safe at home. Summary  Menopause is a normal process in which your ability to get pregnant comes to an end.  This condition causes hot flashes, night sweats, decreased interest in sex, mood swings, headaches, or lack of sleep.  Treatment for this  condition may include hormone replacement therapy.  Take actions to keep yourself healthy, including exercising regularly, eating a healthy diet, watching your weight, and checking your blood pressure and blood sugar levels.  Get screened for cancer and depression. Make sure that you are up to date with all your vaccines. This information is not intended to replace advice given to you by your health care provider. Make sure you discuss any questions you have with your health care provider. Document Revised: 10/10/2018 Document Reviewed: 10/10/2018 Elsevier Patient Education  2021 [employer].  Osteoporosis  Osteoporosis happens when the bones become thin and less dense than normal. Osteoporosis makes bones more brittle and fragile and more likely to break (fracture). Over time, osteoporosis can cause your bones to become so weak that they fracture after a minor fall. Bones in the hip, wrist, and spine are most likely to fracture due to osteoporosis. What are the causes? The exact cause of this condition is not known. What increases the risk? You are more likely to develop this condition if you:  Have family members with this condition.  Have poor nutrition.  Use the following: ? Steroid medicines, such as prednisone. ? Anti-seizure medicines. ? Nicotine or tobacco, such as cigarettes,  e-cigarettes, and chewing tobacco.  Are female.  Are age 68 or older.  Are not physically active (are sedentary).  Are of European or Asian descent.  Have a small body frame. What are the signs or symptoms? A fracture might be the first sign of osteoporosis, especially if the fracture results from a fall or injury that usually would not cause a bone to break. Other signs and symptoms include:  Pain in the neck or low back.  Stooped posture.  Loss of height. How is this diagnosed? This condition may be diagnosed based on:  Your medical history.  A physical exam.  A bone mineral  density test, also called a DXA or DEXA test (dual-energy X-ray absorptiometry test). This test uses X-rays to measure the amount of minerals in your bones. How is this treated? This condition may be treated by:  Making lifestyle changes, such as: ? Including foods with more calcium and vitamin D in your diet. ? Doing weight-bearing and muscle-strengthening exercises. ? Stopping tobacco use. ? Limiting alcohol intake.  Taking medicine to slow the process of bone loss or to increase bone density.  Taking daily supplements of calcium and vitamin D.  Taking hormone replacement medicines, such as estrogen for women and testosterone for men.  Monitoring your levels of calcium and vitamin D. The goal of treatment is to strengthen your bones and lower your risk for a fracture. Follow these instructions at home: Eating and drinking Include calcium and vitamin D in your diet. Calcium is important for bone health, and vitamin D helps your body absorb calcium. Good sources of calcium and vitamin D include:  Certain fatty fish, such as salmon and tuna.  Products that have calcium and vitamin D added to them (are fortified), such as fortified cereals.  Egg yolks.  Cheese.  Liver.   Activity Do exercises as told by your health care provider. Ask your health care provider what exercises and activities are safe for you. You should do:  Exercises that make you work against gravity (weight-bearing exercises), such as tai chi, yoga, or walking.  Exercises to strengthen muscles, such as lifting weights. Lifestyle  Do not drink alcohol if: ? Your health care provider tells you not to drink. ? You are pregnant, may be pregnant, or are planning to become pregnant.  If you drink alcohol: ? Limit how much you use to:  0-1 drink a day for women.  0-2 drinks a day for men.  Know how much alcohol is in your drink. In the [place] one drink equals one 12 oz bottle of beer (355 mL), one 5 oz glass  of wine (148 mL), or one 1 oz glass of hard liquor (44 mL).  Do not use any products that contain nicotine or tobacco, such as cigarettes, e-cigarettes, and chewing tobacco. If you need help quitting, ask your health care provider. Preventing falls  Use devices to help you move around (mobility aids) as needed, such as canes, walkers, scooters, or crutches.  Keep rooms well-lit and clutter-free.  Remove tripping hazards from walkways, including cords and throw rugs.  Install grab bars in bathrooms and safety rails on stairs.  Use rubber mats in the bathroom and other areas that are often wet or slippery.  Wear closed-toe shoes that fit well and support your feet. Wear shoes that have rubber soles or low heels.  Review your medicines with your health care provider. Some medicines can cause dizziness or changes in blood pressure, which can increase  your risk of falling. General instructions  Take over-the-counter and prescription medicines only as told by your health care provider.  Keep all follow-up visits. This is important. Contact a health care provider if:  You have never been screened for osteoporosis and you are: ? A woman who is age 59 or older. ? A man who is age 19 or older. Get help right away if:  You fall or injure yourself. Summary  Osteoporosis is thinning and loss of density in your bones. This makes bones more brittle and fragile and more likely to break (fracture),even with minor falls.  The goal of treatment is to strengthen your bones and lower your risk for a fracture.  Include calcium and vitamin D in your diet. Calcium is important for bone health, and vitamin D helps your body absorb calcium.  Talk with your health care provider about screening for osteoporosis if you are a woman who is age 85 or older, or a man who is age 69 or older. This information is not intended to replace advice given to you by your health care provider. Make sure you discuss any  questions you have with your health care provider. Document Revised: 04/02/2020 Document Reviewed: 04/02/2020 Elsevier Patient Education  [employer].

## 2021-03-24 NOTE — Progress Notes (Signed)
   Kylie Kelley 17-Mar-1966 [id number]   History:  55 y.o. [id number] presents for annual exam. Postmenopausal - no HRT, no bleeding. LGSIL many years ago, subsequent paps normal. History of osteopenia, hypothyroidism. Had a fall out of her attic in December and tore ACL, MCL, meniscus and broke knee cap in 3 places. Did not require surgery and is doing physical therapy with much improvement.   Gynecologic History Patient's last menstrual period was 08/16/2013.   Contraception/Family planning: post menopausal status  Health Maintenance Last Pap: 03/17/2020. Results were: normal Last mammogram: 06/10/2020. Results were: normal Last colonoscopy: 2016 Last Dexa: 04/24/2019. Results were: T-score -2.4, FRAX 7.5% / 1.5%  Past medical history, past surgical history, family history and social history were all reviewed and documented in the EPIC chart. Married. 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  ROS:  A ROS was performed and pertinent positives and negatives are included.  Exam:  Vitals:   03/24/21 1445  BP: 112/76  Weight: 116 lb (52.6 kg)  Height: 5\' 5"  (1.651 m)   Body mass index is 19.3 kg/m.  General appearance:  Normal Thyroid:  Symmetrical, normal in size, without palpable masses or nodularity. Respiratory  Auscultation:  Clear without wheezing or rhonchi Cardiovascular  Auscultation:  Regular rate, without rubs, murmurs or gallops  Edema/varicosities:  Not grossly evident Abdominal  Soft,nontender, without masses, guarding or rebound.  Liver/spleen:  No organomegaly noted  Hernia:  None appreciated  Skin  Inspection:  Grossly normal Breasts: Examined lying and sitting.   Right: Without masses, retractions, nipple discharge or axillary adenopathy.   Left: Without masses, retractions, nipple discharge or axillary adenopathy. Genitourinary   Inguinal/mons:  Normal without inguinal adenopathy  External genitalia:   Thin, pearly appearance in figure 8 pattern consistent with LS  BUS/Urethra/Skene's glands:  Normal  Vagina:  Normal appearing with normal color and discharge, no lesions. Atrophic changes  Cervix:  Normal appearing without discharge or lesions. Stenotic  Uterus:  Normal in size, shape and contour.  Midline and mobile, nontender  Adnexa/parametria:     Rt: Normal in size, without masses or tenderness.   Lt: Normal in size, without masses or tenderness.  Anus and perineum: Normal  Digital rectal exam: Normal sphincter tone without palpated masses or tenderness  Assessment/Plan:  55 y.o. [id number] for annual exam.   Well female exam with routine gynecological exam - Education provided on SBEs, importance of preventative screenings, current guidelines, high calcium diet, regular exercise, and multivitamin daily. Labs with PCP.   Osteopenia of multiple sites - Plan: DG Bone Density. DXA 04/24/2019 T-score -2.4, FRAX 7.5% / 1.5%. Taking daily Vitamin D supplement and exercises regularly.  Postmenopausal - Plan: DG Bone Density. No HRT, no bleeding.   Lichen sclerosus - Plan: clobetasol ointment (TEMOVATE) 0.05 % daily x 4 weeks, then decrease to every other day x 4 weeks, then twice weekly. Evidence of LS changes seen on exam and she does report intermittent itching.irritation.   Screening for cervical cancer - Normal Pap history. She requests annual paps. Pap with reflex today.   Screening for breast cancer - Normal mammogram history.  Continue annual screenings.  Normal breast exam today.  Screening for colon cancer - 2016 colonoscopy. Will repeat at GI's recommended interval.   Return in 1 year for annual.    [place], 3:29 PM 03/24/2021

## 2021-03-25 LAB — CYTOLOGY - PAP: Diagnosis: NEGATIVE

## 2021-04-05 ENCOUNTER — Telehealth: Payer: Self-pay | Admitting: *Deleted

## 2021-04-05 NOTE — Telephone Encounter (Signed)
Patient called requesting OB records and how she can get them. I check with Rosemarie Ax because I was unsure and was told if patient is seeking OB record for a child older than 18 those records have been destroyed.

## 2021-04-27 ENCOUNTER — Telehealth: Payer: Self-pay | Admitting: *Deleted

## 2021-04-27 ENCOUNTER — Other Ambulatory Visit: Payer: Self-pay

## 2021-04-27 ENCOUNTER — Ambulatory Visit (INDEPENDENT_AMBULATORY_CARE_PROVIDER_SITE_OTHER): Payer: PRIVATE HEALTH INSURANCE

## 2021-04-27 ENCOUNTER — Other Ambulatory Visit: Payer: Self-pay | Admitting: Nurse Practitioner

## 2021-04-27 DIAGNOSIS — M8589 Other specified disorders of bone density and structure, multiple sites: Secondary | ICD-10-CM

## 2021-04-27 DIAGNOSIS — Z780 Asymptomatic menopausal state: Secondary | ICD-10-CM

## 2021-04-27 DIAGNOSIS — E039 Hypothyroidism, unspecified: Secondary | ICD-10-CM

## 2021-04-27 NOTE — Telephone Encounter (Signed)
Patient in office for BMD.  Was seen in office on 03/24/21 for AEX with Marny Lowenstein, NP. Per review of OV note, labs with PCP.   Patient is requesting Vit D and Thyroid labs. States no labs with PCP.  Advised patient Jonelle Sidle is out of the office this week. Can collect blood. Will review with covering provider and return call to advise. Patient agreeable.   Dr. Dellis Filbert -please advise on Vit D and Thyroid labs.

## 2021-04-28 NOTE — Telephone Encounter (Signed)
Kylie Bruins, MD  You 14 hours ago (5:54 PM)     Agree with TSH and Vit D.    Orders placed for TSH and Vitamin D.   Patient notified -left detailed message, ok per dpr.    Encounter closed.

## 2021-04-29 LAB — TSH: TSH: 2.26 mIU/L (ref 0.40–4.50)

## 2021-04-29 LAB — VITAMIN D 25 HYDROXY (VIT D DEFICIENCY, FRACTURES): Vit D, 25-Hydroxy: 113 ng/mL — ABNORMAL HIGH (ref 30–100)

## 2022-02-04 ENCOUNTER — Encounter (HOSPITAL_COMMUNITY): Payer: Self-pay | Admitting: Emergency Medicine

## 2022-02-04 ENCOUNTER — Emergency Department (HOSPITAL_COMMUNITY)
Admission: EM | Admit: 2022-02-04 | Discharge: 2022-02-04 | Disposition: A | Discharge status: Home / Self Care | Payer: PRIVATE HEALTH INSURANCE | Attending: Emergency Medicine | Admitting: Emergency Medicine

## 2022-02-04 DIAGNOSIS — Z91040 Latex allergy status: Secondary | ICD-10-CM | POA: Diagnosis not present

## 2022-02-04 DIAGNOSIS — S0181XA Laceration without foreign body of other part of head, initial encounter: Secondary | ICD-10-CM | POA: Insufficient documentation

## 2022-02-04 DIAGNOSIS — S0990XA Unspecified injury of head, initial encounter: Secondary | ICD-10-CM | POA: Diagnosis present

## 2022-02-04 DIAGNOSIS — W228XXA Striking against or struck by other objects, initial encounter: Secondary | ICD-10-CM | POA: Insufficient documentation

## 2022-02-04 NOTE — Discharge Instructions (Addendum)
You were seen in the emergency department today for a facial laceration. ? ?We able to glue this closed. I've attached some instructions about this.  You may shower, but do not soak or scrub the area for 7 to 10 days.  Then make sure that you pat the area dry.  The adhesive should peel off in about 5 to 10 days.  If it comes off sooner that is okay.  If it lasts longer than that, you can use some Vaseline to help it come off on its own. ? ?I have sent a referral over to Dr. Harlow Mares, and have also attached his contact information for you to call and make an appointment if you do not hear from them on Tuesday. ?

## 2022-02-04 NOTE — ED Triage Notes (Signed)
Patient reports laceration to L head from hitting head on trunk while loading luggage in the car. Denies LOC. ?

## 2022-02-04 NOTE — ED Provider Notes (Signed)
?[facility] ?Provider Note ? ? ?CSN: [id number] ?Arrival date & time: 02/04/22  1309 ? ?  ? ?History ? ?Chief Complaint  ?Patient presents with  ? Laceration  ? ? ?Kylie Kelley is a 56 y.o. female who presents the emergency department with a laceration to the left side of her head after hitting her head on the car trunk door while trying to load luggage into her car.  There was no loss of consciousness.  No other trauma noted. ? ? ?Laceration ? ?  ? ?Home Medications ?Prior to Admission medications   ?Medication Sig Start Date End Date Taking? Authorizing Provider  ?ALPRAZolam (XANAX) 0.25 MG tablet Take 0.25 mg by mouth daily as needed. 10/25/14   [provider]  ?Ascorbic Acid (VITAMIN C PO) Take 1 tablet by mouth daily.     [provider]  ?clobetasol ointment (TEMOVATE) 0.05 % Apply daily x 4 weeks, then every other day x 4 weeks, then twice weekly 03/24/21   Tamela Gammon, NP  ?Glucos-Chond-Hyal Ac-Ca Fructo (MOVE FREE JOINT HEALTH ADVANCE PO) Take 1 tablet by mouth daily.    [provider]  ?levothyroxine (SYNTHROID, LEVOTHROID) 112 MCG tablet Take 1 tablet (112 mcg total) by mouth daily. ?Patient not taking: Reported on 03/24/2021 12/13/16   [name], NP  ?Prenatal Vit-Fe Sulfate-FA (PRENATAL VITAMIN PO) Take 1 tablet by mouth daily.     [provider]  ?   ? ?Allergies    ?Latex   ? ?Review of Systems   ?Review of Systems  ?Skin:   ?     Forehead laceration  ?Neurological:  Negative for syncope.  ?All other systems reviewed and are negative. ? ?Physical Exam ?Updated Vital Signs ?BP (!) 138/93 (BP Location: Right Arm)   Pulse 82   Temp 98 ?F (36.7 ?C) (Oral)   Resp 18   LMP 08/16/2013   SpO2 100%  ?Physical Exam ?Vitals and nursing note reviewed.  ?Constitutional:   ?   Appearance: Normal appearance.  ?HENT:  ?   Head: Normocephalic and atraumatic.  ?Eyes:  ?   Conjunctiva/sclera: Conjunctivae normal.  ?Pulmonary:  ?    Effort: Pulmonary effort is normal. No respiratory distress.  ?Skin: ?   General: Skin is warm and dry.  ?   Comments: Approximately 2 cm linear laceration over the left forehead just below the scalp line.  Mild gaping in the center of the wound, otherwise mostly superficial.  Bleeding is controlled.  ?Neurological:  ?   Mental Status: She is alert.  ?Psychiatric:     ?   Mood and Affect: Mood normal.     ?   Behavior: Behavior normal.  ? ? ? ? ? ? ?ED Results / Procedures / Treatments   ?Labs ?(all labs ordered are listed, but only abnormal results are displayed) ?Labs Reviewed - No data to display ? ?EKG ?None ? ?Radiology ?No results found. ? ?Procedures ?Marland Kitchen.Laceration Repair ? ?Date/Time: 02/04/2022 2:52 PM ?Performed by: Kateri Plummer, PA-C ?Authorized by: Kateri Plummer, PA-C  ? ?Consent:  ?  Consent obtained:  Verbal ?  Consent given by:  Patient ?  Risks discussed:  Poor cosmetic result and pain ?Laceration details:  ?  Location:  Face ?  Face location:  Forehead ?  Length (cm):  2 ?Treatment:  ?  Area cleansed with:  Chlorhexidine ?  Amount of cleaning:  Standard ?Skin repair:  ?  Repair method:  Tissue adhesive ?Approximation:  ?  Approximation:  Close ?Repair type:  ?  Repair type:  Simple ?Post-procedure details:  ?  Dressing:  Adhesive bandage ?  Procedure completion:  Tolerated  ? ? ?Medications Ordered in ED ?Medications - No data to display ? ?ED Course/ Medical Decision Making/ A&P ?  ?                        ?Medical Decision Making ?This patient is a 56 year old female who presents to the ED for concern of forehead laceration.  ? ?Past Medical History / Co-morbidities / Social History: ?Hypothyroidism, osteopenia ? ?Physical Exam: ?Physical exam performed. The pertinent findings include: Approximately 2 cm linear laceration over the left forehead just below the scalp line.  Mild gaping in the center of the wound, otherwise mostly superficial.  ? ?Procedure: ?Laceration was cleaned and  closed with dermabond glue. Patient tolerated procedure well with no immediate complications. Wound was well approximated. ?  ?Disposition: ?After consideration of the diagnostic results and the patients response to treatment, I feel that patient's not requiring admission or inpatient treatment for symptoms. Wound was cleaned and closed. Patient is requesting referral to Dr. Harlow Mares with plastic surgery. Discussed reasons to return to the emergency department, and the patient is agreeable to the plan. ? ?Portions of this report may have been transcribed using voice recognition software. Every effort was made to ensure accuracy; however, inadvertent computerized transcription errors may be present. ? ?Final Clinical Impression(s) / ED Diagnoses ?Final diagnoses:  ?Facial laceration, initial encounter  ? ? ?Rx / DC Orders ?ED Discharge Orders   ? ?      Ordered  ?  Ambulatory referral to Plastic Surgery       ?Comments: Dr. Crissie Reese if possible  ? 02/04/22 1437  ? ?  ?  ? ?  ? ? ?  ?Kateri Plummer, PA-C ?02/04/22 1454 ? ?  ?Varney Biles, MD ?02/05/22 1215 ? ?

## 2022-03-29 ENCOUNTER — Encounter: Payer: Self-pay | Admitting: Nurse Practitioner

## 2022-03-29 ENCOUNTER — Other Ambulatory Visit (HOSPITAL_COMMUNITY)
Admission: RE | Admit: 2022-03-29 | Discharge: 2022-03-29 | Disposition: A | Discharge status: Home / Self Care | Payer: PRIVATE HEALTH INSURANCE | Source: Ambulatory Visit | Attending: Nurse Practitioner | Admitting: Nurse Practitioner

## 2022-03-29 ENCOUNTER — Ambulatory Visit (INDEPENDENT_AMBULATORY_CARE_PROVIDER_SITE_OTHER): Payer: PRIVATE HEALTH INSURANCE | Admitting: Nurse Practitioner

## 2022-03-29 VITALS — BP 116/78 | Ht 64.0 in | Wt 114.0 lb

## 2022-03-29 DIAGNOSIS — M8589 Other specified disorders of bone density and structure, multiple sites: Secondary | ICD-10-CM | POA: Diagnosis not present

## 2022-03-29 DIAGNOSIS — Z01419 Encounter for gynecological examination (general) (routine) without abnormal findings: Secondary | ICD-10-CM | POA: Diagnosis not present

## 2022-03-29 DIAGNOSIS — Z780 Asymptomatic menopausal state: Secondary | ICD-10-CM

## 2022-03-29 DIAGNOSIS — L900 Lichen sclerosus et atrophicus: Secondary | ICD-10-CM

## 2022-03-29 DIAGNOSIS — Z1321 Encounter for screening for nutritional disorder: Secondary | ICD-10-CM

## 2022-03-29 MED ORDER — CLOBETASOL PROPIONATE 0.05 % EX OINT: TOPICAL_OINTMENT | CUTANEOUS | 2 refills | Status: DC

## 2022-03-29 NOTE — Progress Notes (Signed)
   Kylie Kelley December 27, 1965 [id number]   History:  56 y.o. [id number] presents for annual exam. Postmenopausal - no HRT, no bleeding. LGSIL many years ago, subsequent paps normal. History of osteopenia, hypothyroidism. Moving to [place].   Gynecologic History Patient's last menstrual period was 08/16/2013.   Contraception/Family planning: post menopausal status Sexually active: Yes  Health Maintenance Last Pap: 03/24/2021. Results were: Normal Last mammogram: 06/28/2021. Results were: Normal Last colonoscopy: 2016 Last Dexa: 04/27/2021. Results were: T-score -2.3, FRAX 7.9% / 1.4%  Past medical history, past surgical history, family history and social history were all reviewed and documented in the EPIC chart. Married. 4 children - 64 yo son, twin girls ages 62, 33 year old daughter. All children live in [place].   ROS:  A ROS was performed and pertinent positives and negatives are included.  Exam:  Vitals:   03/29/22 1332  BP: 116/78  Weight: 114 lb (51.7 kg)  Height: '5\' 4"'$  (1.626 m)    Body mass index is 19.57 kg/m.  General appearance:  Normal Thyroid:  Symmetrical, normal in size, without palpable masses or nodularity. Respiratory  Auscultation:  Clear without wheezing or rhonchi Cardiovascular  Auscultation:  Regular rate, without rubs, murmurs or gallops  Edema/varicosities:  Not grossly evident Abdominal  Soft,nontender, without masses, guarding or rebound.  Liver/spleen:  No organomegaly noted  Hernia:  None appreciated  Skin  Inspection:  Grossly normal Breasts: Examined lying and sitting.   Right: Without masses, retractions, nipple discharge or axillary adenopathy.   Left: Without masses, retractions, nipple discharge or axillary adenopathy. Genitourinary   Inguinal/mons:  Normal without inguinal adenopathy  External genitalia:  Hypopigmentation along labia and perineum consistent with LS  BUS/Urethra/Skene's glands:  Normal  Vagina:  Normal  appearing with normal color and discharge, no lesions. Atrophic changes  Cervix:  Normal appearing without discharge or lesions. Stenotic  Uterus:  Normal in size, shape and contour.  Midline and mobile, nontender  Adnexa/parametria:     Rt: Normal in size, without masses or tenderness.   Lt: Normal in size, without masses or tenderness.  Anus and perineum: Normal  Digital rectal exam: Normal sphincter tone without palpated masses or tenderness  Patient informed chaperone available to be present for breast and pelvic exam. Patient has requested no chaperone to be present. Patient has been advised what will be completed during breast and pelvic exam.   Assessment/Plan:  56 y.o. [id number] for annual exam.   Well female exam with routine gynecological exam - Plan: Cytology - PAP( [facility]), TSH, CBC with Differential/Platelet, Comprehensive metabolic panel. Education provided on SBEs, importance of preventative screenings, current guidelines, high calcium diet, regular exercise, and multivitamin daily.   Postmenopausal - no HRT, no bleeding  Osteopenia of multiple sites - T-score -2.3 without elevated FRAX 03/2021. Taking daily Vitamin D supplement and exercises regularly. Will repeat DXA next year.   Lichen sclerosus - Plan: clobetasol ointment (TEMOVATE) 0.05 % twice weekly.   Encounter for vitamin deficiency screening - Plan: VITAMIN D 25 Hydroxy (Vit-D Deficiency, Fractures)  Screening for cervical cancer - Normal Pap history. She requests annual paps. Pap with reflex today.   Screening for breast cancer - Normal mammogram history.  Continue annual screenings.  Normal breast exam today.  Screening for colon cancer - 2016 colonoscopy. Will repeat at GI's recommended interval.   Return in 1 year for annual.      Tamela Gammon DNP, 2:01 PM 03/29/2022

## 2022-03-30 LAB — COMPREHENSIVE METABOLIC PANEL
AG Ratio: 2 (calc) (ref 1.0–2.5)
ALT: 16 U/L (ref 6–29)
AST: 23 U/L (ref 10–35)
Albumin: 4.5 g/dL (ref 3.6–5.1)
Alkaline phosphatase (APISO): 64 U/L (ref 37–153)
BUN/Creatinine Ratio: 38 (calc) — ABNORMAL HIGH (ref 6–22)
BUN: 27 mg/dL — ABNORMAL HIGH (ref 7–25)
CO2: 26 mmol/L (ref 20–32)
Calcium: 9.8 mg/dL (ref 8.6–10.4)
Chloride: 104 mmol/L (ref 98–110)
Creat: 0.72 mg/dL (ref 0.50–1.03)
Globulin: 2.2 g/dL (calc) (ref 1.9–3.7)
Glucose, Bld: 95 mg/dL (ref 65–99)
Potassium: 5 mmol/L (ref 3.5–5.3)
Sodium: 140 mmol/L (ref 135–146)
Total Bilirubin: 0.4 mg/dL (ref 0.2–1.2)
Total Protein: 6.7 g/dL (ref 6.1–8.1)

## 2022-03-30 LAB — VITAMIN D 25 HYDROXY (VIT D DEFICIENCY, FRACTURES): Vit D, 25-Hydroxy: 44 ng/mL (ref 30–100)

## 2022-03-30 LAB — CBC WITH DIFFERENTIAL/PLATELET
Absolute Monocytes: 392 cells/uL (ref 200–950)
Basophils Absolute: 42 cells/uL (ref 0–200)
Basophils Relative: 0.8 %
Eosinophils Absolute: 111 cells/uL (ref 15–500)
Eosinophils Relative: 2.1 %
HCT: 38 % (ref 35.0–45.0)
Hemoglobin: 12.8 g/dL (ref 11.7–15.5)
Lymphs Abs: 1675 cells/uL (ref 850–3900)
MCH: 31.8 pg (ref 27.0–33.0)
MCHC: 33.7 g/dL (ref 32.0–36.0)
MCV: 94.3 fL (ref 80.0–100.0)
MPV: 10.9 fL (ref 7.5–12.5)
Monocytes Relative: 7.4 %
Neutro Abs: 3079 cells/uL (ref 1500–7800)
Neutrophils Relative %: 58.1 %
Platelets: 243 10*3/uL (ref 140–400)
RBC: 4.03 10*6/uL (ref 3.80–5.10)
RDW: 12.4 % (ref 11.0–15.0)
Total Lymphocyte: 31.6 %
WBC: 5.3 10*3/uL (ref 3.8–10.8)

## 2022-03-30 LAB — TSH: TSH: 1.99 mIU/L (ref 0.40–4.50)

## 2022-04-07 LAB — CYTOLOGY - PAP: Diagnosis: NEGATIVE

## 2022-07-07 ENCOUNTER — Encounter: Payer: Self-pay | Admitting: Nurse Practitioner

## 2023-04-03 ENCOUNTER — Ambulatory Visit: Payer: PRIVATE HEALTH INSURANCE | Admitting: Nurse Practitioner

## 2023-06-14 ENCOUNTER — Encounter: Payer: Self-pay | Admitting: Nurse Practitioner

## 2023-06-14 ENCOUNTER — Other Ambulatory Visit (HOSPITAL_COMMUNITY)
Admission: RE | Admit: 2023-06-14 | Discharge: 2023-06-14 | Disposition: A | Discharge status: Home / Self Care | Payer: PRIVATE HEALTH INSURANCE | Source: Ambulatory Visit | Attending: Nurse Practitioner | Admitting: Nurse Practitioner

## 2023-06-14 ENCOUNTER — Ambulatory Visit (INDEPENDENT_AMBULATORY_CARE_PROVIDER_SITE_OTHER): Payer: PRIVATE HEALTH INSURANCE | Admitting: Nurse Practitioner

## 2023-06-14 VITALS — BP 110/74 | HR 92 | Ht 63.5 in | Wt 114.0 lb

## 2023-06-14 DIAGNOSIS — Z01419 Encounter for gynecological examination (general) (routine) without abnormal findings: Secondary | ICD-10-CM | POA: Diagnosis not present

## 2023-06-14 DIAGNOSIS — L900 Lichen sclerosus et atrophicus: Secondary | ICD-10-CM | POA: Diagnosis not present

## 2023-06-14 DIAGNOSIS — M8589 Other specified disorders of bone density and structure, multiple sites: Secondary | ICD-10-CM

## 2023-06-14 DIAGNOSIS — Z780 Asymptomatic menopausal state: Secondary | ICD-10-CM | POA: Diagnosis not present

## 2023-06-14 DIAGNOSIS — Z124 Encounter for screening for malignant neoplasm of cervix: Secondary | ICD-10-CM | POA: Diagnosis not present

## 2023-06-14 NOTE — Progress Notes (Signed)
   Kylie Kelley September 14, 1966 [id number]   History:  57 y.o. [id number] presents for annual exam. Postmenopausal - no HRT, no bleeding. LGSIL many years ago, subsequent paps normal. History of osteopenia, hypothyroidism.   Gynecologic History Patient's last menstrual period was 08/16/2013.   Contraception/Family planning: post menopausal status Sexually active: Yes  Health Maintenance Last Pap: 03/29/2022. Results were: Normal Last mammogram: 07/05/2022. Results were: Normal Last colonoscopy: 2016 Last Dexa: 04/27/2021. Results were: T-score -2.3, FRAX 7.9% / 1.4%  Past medical history, past surgical history, family history and social history were all reviewed and documented in the EPIC chart. Married. 4 children - 30 yo son, twin girls ages 6, 91 year old daughter. All children live in [place]. Patient and husband moved there last year.   ROS:  A ROS was performed and pertinent positives and negatives are included.  Exam:  Vitals:   06/14/23 1554  BP: 110/74  Pulse: 92  SpO2: 97%  Weight: 114 lb (51.7 kg)  Height: 5' 3.5" (1.613 m)     Body mass index is 19.88 kg/m.  General appearance:  Normal Thyroid:  Symmetrical, normal in size, without palpable masses or nodularity. Respiratory  Auscultation:  Clear without wheezing or rhonchi Cardiovascular  Auscultation:  Regular rate, without rubs, murmurs or gallops  Edema/varicosities:  Not grossly evident Abdominal  Soft,nontender, without masses, guarding or rebound.  Liver/spleen:  No organomegaly noted  Hernia:  None appreciated  Skin  Inspection:  Grossly normal Breasts: Examined lying and sitting.   Right: Without masses, retractions, nipple discharge or axillary adenopathy.   Left: Without masses, retractions, nipple discharge or axillary adenopathy. Genitourinary   Inguinal/mons:  Normal without inguinal adenopathy  External genitalia:  Hypopigmentation along labia and perineum consistent with  LS  BUS/Urethra/Skene's glands:  Normal  Vagina:  Normal appearing with normal color and discharge, no lesions. Atrophic changes  Cervix:  Normal appearing without discharge or lesions. Stenotic  Uterus:  Normal in size, shape and contour.  Midline and mobile, nontender  Adnexa/parametria:     Rt: Normal in size, without masses or tenderness.   Lt: Normal in size, without masses or tenderness.  Anus and perineum: Normal  Digital rectal exam: Not indicated  Patient informed chaperone available to be present for breast and pelvic exam. Patient has requested no chaperone to be present. Patient has been advised what will be completed during breast and pelvic exam.   Assessment/Plan:  57 y.o. [id number] for annual exam.   Well female exam with routine gynecological exam - Plan: CBC with Differential/Platelet, Comprehensive metabolic panel. Education provided on SBEs, importance of preventative screenings, current guidelines, high calcium diet, regular exercise, and multivitamin daily.   Postmenopausal - no HRT, no bleeding  Osteopenia of multiple sites - T-score -2.3 without elevated FRAX 03/2021. Taking daily Vitamin D supplement and exercises regularly. Recall for DXA.  Screening for cervical cancer - Normal Pap history. She requests annual paps. Pap with reflex today.   Screening for breast cancer - Normal mammogram history.  Continue annual screenings.  Normal breast exam today.  Screening for colon cancer - 2016 colonoscopy. Will repeat at GI's recommended interval.   Return in 1 year for annual.      Olivia Mackie DNP, 4:06 PM 06/14/2023

## 2023-06-15 LAB — COMPREHENSIVE METABOLIC PANEL
AG Ratio: 2.5 (calc) (ref 1.0–2.5)
ALT: 20 U/L (ref 6–29)
AST: 24 U/L (ref 10–35)
Albumin: 4.9 g/dL (ref 3.6–5.1)
Alkaline phosphatase (APISO): 63 U/L (ref 37–153)
BUN: 17 mg/dL (ref 7–25)
CO2: 27 mmol/L (ref 20–32)
Calcium: 10 mg/dL (ref 8.6–10.4)
Chloride: 103 mmol/L (ref 98–110)
Creat: 0.78 mg/dL (ref 0.50–1.03)
Globulin: 2 g/dL (ref 1.9–3.7)
Glucose, Bld: 103 mg/dL — ABNORMAL HIGH (ref 65–99)
Potassium: 4.7 mmol/L (ref 3.5–5.3)
Sodium: 139 mmol/L (ref 135–146)
Total Bilirubin: 0.4 mg/dL (ref 0.2–1.2)
Total Protein: 6.9 g/dL (ref 6.1–8.1)

## 2023-06-15 LAB — CBC WITH DIFFERENTIAL/PLATELET
Absolute Monocytes: 359 {cells}/uL (ref 200–950)
Basophils Absolute: 42 {cells}/uL (ref 0–200)
Basophils Relative: 0.8 %
Eosinophils Absolute: 140 {cells}/uL (ref 15–500)
Eosinophils Relative: 2.7 %
HCT: 38.4 % (ref 35.0–45.0)
Hemoglobin: 12.7 g/dL (ref 11.7–15.5)
Lymphs Abs: 1622 {cells}/uL (ref 850–3900)
MCH: 30.8 pg (ref 27.0–33.0)
MCHC: 33.1 g/dL (ref 32.0–36.0)
MCV: 93 fL (ref 80.0–100.0)
MPV: 10.5 fL (ref 7.5–12.5)
Monocytes Relative: 6.9 %
Neutro Abs: 3037 {cells}/uL (ref 1500–7800)
Neutrophils Relative %: 58.4 %
Platelets: 263 10*3/uL (ref 140–400)
RBC: 4.13 10*6/uL (ref 3.80–5.10)
RDW: 12.7 % (ref 11.0–15.0)
Total Lymphocyte: 31.2 %
WBC: 5.2 10*3/uL (ref 3.8–10.8)

## 2023-06-20 LAB — CYTOLOGY - PAP: Diagnosis: NEGATIVE

## 2023-07-13 ENCOUNTER — Encounter: Payer: Self-pay | Admitting: Nurse Practitioner

## 2023-07-24 ENCOUNTER — Encounter: Payer: Self-pay | Admitting: Nurse Practitioner

## 2024-07-24 ENCOUNTER — Ambulatory Visit (INDEPENDENT_AMBULATORY_CARE_PROVIDER_SITE_OTHER): Payer: PRIVATE HEALTH INSURANCE | Admitting: Nurse Practitioner

## 2024-07-24 ENCOUNTER — Other Ambulatory Visit (HOSPITAL_COMMUNITY)
Admission: RE | Admit: 2024-07-24 | Discharge: 2024-07-24 | Disposition: A | Discharge status: Home / Self Care | Payer: PRIVATE HEALTH INSURANCE | Source: Ambulatory Visit | Attending: Nurse Practitioner | Admitting: Nurse Practitioner

## 2024-07-24 ENCOUNTER — Encounter: Payer: Self-pay | Admitting: Nurse Practitioner

## 2024-07-24 VITALS — BP 116/74 | HR 81 | Ht 63.75 in | Wt 116.0 lb

## 2024-07-24 DIAGNOSIS — Z01419 Encounter for gynecological examination (general) (routine) without abnormal findings: Secondary | ICD-10-CM

## 2024-07-24 DIAGNOSIS — Z124 Encounter for screening for malignant neoplasm of cervix: Secondary | ICD-10-CM | POA: Diagnosis present

## 2024-07-24 DIAGNOSIS — R5383 Other fatigue: Secondary | ICD-10-CM | POA: Diagnosis not present

## 2024-07-24 DIAGNOSIS — Z780 Asymptomatic menopausal state: Secondary | ICD-10-CM

## 2024-07-24 DIAGNOSIS — Z1331 Encounter for screening for depression: Secondary | ICD-10-CM

## 2024-07-24 NOTE — Progress Notes (Signed)
 Kylie Kelley 1966-01-17 [id number]   History:  58 y.o. [id number] presents for annual exam. Complains of fatigue for the last few months. It occurs in the afternoon, sometimes requiring her to take a nap or pull over while driving and close her eyes for a few minutes. Snores and wonders if she has sleep apnea. Plans to do sleep study in [place]. Hypothyroidism - takes Synthroid . Postmenopausal - no HRT, no bleeding. LGSIL many years ago, subsequent paps normal.   Gynecologic History Patient's last menstrual period was 08/16/2013.   Contraception/Family planning: post menopausal status Sexually active: Yes  Health Maintenance Last Pap: 06/14/2023. Results were: Normal Last mammogram: 07/21/2023. Results were: Normal Last colonoscopy: 2016 Last Dexa: 04/27/2021. Results were: T-score -2.3, FRAX 7.9% / 1.4%     07/24/2024   10:33 AM  Depression screen PHQ 2/9  Decreased Interest 0  Down, Depressed, Hopeless 0  PHQ - 2 Score 0     Past medical history, past surgical history, family history and social history were all reviewed and documented in the EPIC chart. Married. Lives in [place]. 4 children - 27 yo son, twin girls ages 22, 78 year old daughter. All children live in [place].   ROS:  A ROS was performed and pertinent positives and negatives are included.  Exam:  Vitals:   07/24/24 1027  BP: 116/74  Pulse: 81  SpO2: 97%  Weight: 116 lb (52.6 kg)  Height: 5' 3.75 (1.619 m)     Body mass index is 20.07 kg/m.  General appearance:  Normal Thyroid :  Symmetrical, normal in size, without palpable masses or nodularity. Respiratory  Auscultation:  Clear without wheezing or rhonchi Cardiovascular  Auscultation:  Regular rate, without rubs, murmurs or gallops  Edema/varicosities:  Not grossly evident Abdominal  Soft,nontender, without masses, guarding or rebound.  Liver/spleen:  No organomegaly noted  Hernia:  None appreciated  Skin  Inspection:  Grossly normal Breasts:  Examined lying and sitting.   Right: Without masses, retractions, nipple discharge or axillary adenopathy.   Left: Without masses, retractions, nipple discharge or axillary adenopathy. Pelvic: External genitalia:  no lesions              Urethra:  normal appearing urethra with no masses, tenderness or lesions              Bartholins and Skenes: normal                 Vagina: normal appearing vagina with normal color and discharge, no lesions              Cervix: no lesions Bimanual Exam:  Uterus:  no masses or tenderness              Adnexa: no mass, fullness, tenderness              Rectovaginal: Deferred              Anus:  normal, no lesions  Zada Louder, CMA present as chaperone.   Assessment/Plan:  58 y.o. [id number] for annual exam.   Well female exam with routine gynecological exam - Plan: CBC with Differential/Platelet, Comprehensive metabolic panel. Education provided on SBEs, importance of preventative screenings, current guidelines, high calcium diet, regular exercise, and multivitamin daily.   Postmenopausal - no HRT, no bleeding  Osteopenia of multiple sites - T-score -2.3 without elevated FRAX 03/2021. Taking daily Vitamin D  supplement and exercises regularly. Will look for places in [place] for DXA.   Fatigue, unspecified type -  Plan: CBC with Differential/Platelet, Comprehensive metabolic panel with GFR, TSH. Takes B complex, Vit D, magnesium. Snores and is considering doing sleep study in [place] and I agree with this. Will check thyroid  level. Managed by PCP but he is retiring.   Cervical cancer screening - Plan: Cytology - PAP( [facility]). Normal Pap history. She requests annual paps. Pap with reflex today.   Screening for breast cancer - Normal mammogram history.  Continue annual screenings.  Normal breast exam today.  Screening for colon cancer - 2016 colonoscopy. Will repeat at GI's recommended interval.   Return in about 1 year (around 07/24/2025) for  Annual.      [name] Shutter DNP, 11:20 AM 07/24/2024

## 2024-07-25 LAB — COMPREHENSIVE METABOLIC PANEL WITH GFR
AG Ratio: 2.1 (calc) (ref 1.0–2.5)
ALT: 17 U/L (ref 6–29)
AST: 25 U/L (ref 10–35)
Albumin: 4.6 g/dL (ref 3.6–5.1)
Alkaline phosphatase (APISO): 55 U/L (ref 37–153)
BUN: 14 mg/dL (ref 7–25)
CO2: 27 mmol/L (ref 20–32)
Calcium: 9.6 mg/dL (ref 8.6–10.4)
Chloride: 101 mmol/L (ref 98–110)
Creat: 0.81 mg/dL (ref 0.50–1.03)
Globulin: 2.2 g/dL (ref 1.9–3.7)
Glucose, Bld: 91 mg/dL (ref 65–99)
Potassium: 4.4 mmol/L (ref 3.5–5.3)
Sodium: 137 mmol/L (ref 135–146)
Total Bilirubin: 0.6 mg/dL (ref 0.2–1.2)
Total Protein: 6.8 g/dL (ref 6.1–8.1)
eGFR: 84 mL/min/1.73m2 (ref >=60)

## 2024-07-25 LAB — CBC WITH DIFFERENTIAL/PLATELET
Absolute Lymphocytes: 1548 {cells}/uL (ref 850–3900)
Absolute Monocytes: 412 {cells}/uL (ref 200–950)
Basophils Absolute: 40 {cells}/uL (ref 0–200)
Basophils Relative: 1 %
Eosinophils Absolute: 112 {cells}/uL (ref 15–500)
Eosinophils Relative: 2.8 %
HCT: 37.4 % (ref 35.0–45.0)
Hemoglobin: 12.3 g/dL (ref 11.7–15.5)
MCH: 31.6 pg (ref 27.0–33.0)
MCHC: 32.9 g/dL (ref 32.0–36.0)
MCV: 96.1 fL (ref 80.0–100.0)
MPV: 10.6 fL (ref 7.5–12.5)
Monocytes Relative: 10.3 %
Neutro Abs: 1888 {cells}/uL (ref 1500–7800)
Neutrophils Relative %: 47.2 %
Platelets: 239 Thousand/uL (ref 140–400)
RBC: 3.89 Million/uL (ref 3.80–5.10)
RDW: 12.6 % (ref 11.0–15.0)
Total Lymphocyte: 38.7 %
WBC: 4 Thousand/uL (ref 3.8–10.8)

## 2024-07-25 LAB — TSH: TSH: 2.53 m[IU]/L (ref 0.40–4.50)

## 2024-07-26 LAB — CYTOLOGY - PAP: Diagnosis: NEGATIVE

## 2024-07-29 ENCOUNTER — Ambulatory Visit: Payer: Self-pay | Admitting: Nurse Practitioner

## 2024-08-01 NOTE — Telephone Encounter (Signed)
 Patient notified of normal tsh level & tiffanys recommendations for her to do a sleep study. She agrees & said she will look into getting that done.

## 2025-07-30 ENCOUNTER — Ambulatory Visit: Payer: PRIVATE HEALTH INSURANCE | Admitting: Nurse Practitioner
